# Patient Record
Sex: Female | Born: 1990 | Race: Black or African American | Hispanic: No | Marital: Single | Smoking: Former smoker
Health system: Southern US, Community
[De-identification: ages and names within clinical notes are randomized; demographics above are authoritative.]

## PROBLEM LIST (undated history)

## (undated) DIAGNOSIS — K219 Gastro-esophageal reflux disease without esophagitis: Secondary | ICD-10-CM

## (undated) HISTORY — PX: ADENOIDECTOMY: SUR15

## (undated) HISTORY — PX: TONSILLECTOMY: SUR1361

---
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
## 2012-04-01 ENCOUNTER — Encounter (HOSPITAL_COMMUNITY): Payer: Self-pay | Admitting: *Deleted

## 2012-04-01 ENCOUNTER — Emergency Department (HOSPITAL_COMMUNITY)
Admission: EM | Admit: 2012-04-01 | Discharge: 2012-04-01 | Disposition: A | Discharge status: Home / Self Care | Payer: Self-pay | Attending: Emergency Medicine | Admitting: Emergency Medicine

## 2012-04-01 DIAGNOSIS — R110 Nausea: Secondary | ICD-10-CM | POA: Insufficient documentation

## 2012-04-01 DIAGNOSIS — F17200 Nicotine dependence, unspecified, uncomplicated: Secondary | ICD-10-CM | POA: Insufficient documentation

## 2012-04-01 DIAGNOSIS — Z711 Person with feared health complaint in whom no diagnosis is made: Secondary | ICD-10-CM

## 2012-04-01 DIAGNOSIS — R197 Diarrhea, unspecified: Secondary | ICD-10-CM | POA: Insufficient documentation

## 2012-04-01 DIAGNOSIS — Z3202 Encounter for pregnancy test, result negative: Secondary | ICD-10-CM | POA: Insufficient documentation

## 2012-04-01 LAB — URINALYSIS, MICROSCOPIC ONLY
Bilirubin Urine: NEGATIVE
Glucose, UA: NEGATIVE mg/dL
Ketones, ur: 15 mg/dL — AB
Nitrite: NEGATIVE
Protein, ur: 30 mg/dL — AB
Specific Gravity, Urine: 1.026 (ref 1.005–1.030)
Urobilinogen, UA: 0.2 mg/dL (ref 0.0–1.0)
pH: 8.5 — ABNORMAL HIGH (ref 5.0–8.0)

## 2012-04-01 LAB — CBC WITH DIFFERENTIAL/PLATELET
Basophils Absolute: 0 K/uL (ref 0.0–0.1)
Basophils Relative: 0 % (ref 0–1)
Eosinophils Absolute: 0 10*3/uL (ref 0.0–0.7)
Eosinophils Relative: 1 % (ref 0–5)
HCT: 39.1 % (ref 36.0–46.0)
Hemoglobin: 13.1 g/dL (ref 12.0–15.0)
Lymphocytes Relative: 24 % (ref 12–46)
Lymphs Abs: 1.4 10*3/uL (ref 0.7–4.0)
MCH: 29 pg (ref 26.0–34.0)
MCHC: 33.5 g/dL (ref 30.0–36.0)
MCV: 86.5 fL (ref 78.0–100.0)
Monocytes Absolute: 0.3 10*3/uL (ref 0.1–1.0)
Monocytes Relative: 5 % (ref 3–12)
Neutro Abs: 4.3 K/uL (ref 1.7–7.7)
Neutrophils Relative %: 70 % (ref 43–77)
Platelets: 205 K/uL (ref 150–400)
RBC: 4.52 MIL/uL (ref 3.87–5.11)
RDW: 13.8 % (ref 11.5–15.5)
WBC: 6.1 K/uL (ref 4.0–10.5)

## 2012-04-01 LAB — COMPREHENSIVE METABOLIC PANEL
Albumin: 4.1 g/dL (ref 3.5–5.2)
Alkaline Phosphatase: 49 U/L (ref 39–117)
BUN: 9 mg/dL (ref 6–23)
Creatinine, Ser: 0.59 mg/dL (ref 0.50–1.10)
GFR calc Af Amer: >90 mL/min (ref >90)
Glucose, Bld: 115 mg/dL — ABNORMAL HIGH (ref 70–99)
Potassium: 3.5 mEq/L (ref 3.5–5.1)
Total Protein: 7.3 g/dL (ref 6.0–8.3)

## 2012-04-01 LAB — COMPREHENSIVE METABOLIC PANEL WITH GFR
ALT: 21 U/L (ref 0–35)
AST: 24 U/L (ref 0–37)
CO2: 23 meq/L (ref 19–32)
Calcium: 9.7 mg/dL (ref 8.4–10.5)
Chloride: 102 meq/L (ref 96–112)
GFR calc non Af Amer: >90 mL/min (ref >90)
Sodium: 137 meq/L (ref 135–145)
Total Bilirubin: 0.3 mg/dL (ref 0.3–1.2)

## 2012-04-01 LAB — WET PREP, GENITAL
Trich, Wet Prep: NONE SEEN
Yeast Wet Prep HPF POC: NONE SEEN

## 2012-04-01 LAB — LIPASE, BLOOD: Lipase: 31 U/L (ref 11–59)

## 2012-04-01 LAB — POCT PREGNANCY, URINE: Preg Test, Ur: NEGATIVE

## 2012-04-01 MED ORDER — TRAMADOL HCL 50 MG PO TABS: 50.0000 mg | ORAL_TABLET | Freq: Four times a day (QID) | ORAL | Status: DC | PRN

## 2012-04-01 MED ORDER — SODIUM CHLORIDE 0.9 % IV BOLUS (SEPSIS)
1000.0000 mL | Freq: Once | INTRAVENOUS | Status: AC
  Administered 2012-04-01: 1000 mL via INTRAVENOUS

## 2012-04-01 MED ORDER — PROMETHAZINE HCL 25 MG/ML IJ SOLN
25.0000 mg | INTRAMUSCULAR | Status: AC
  Administered 2012-04-01: 25 mg via INTRAMUSCULAR
  Filled 2012-04-01: qty 1

## 2012-04-01 MED ORDER — AZITHROMYCIN 250 MG PO TABS: 250.0000 mg | ORAL_TABLET | Freq: Every day | ORAL | Status: DC

## 2012-04-01 MED ORDER — HYDROCODONE-ACETAMINOPHEN 5-325 MG PO TABS
1.0000 | ORAL_TABLET | Freq: Once | ORAL | Status: AC
  Administered 2012-04-01: 1 via ORAL
  Filled 2012-04-01: qty 1

## 2012-04-01 MED ORDER — AZITHROMYCIN 250 MG PO TABS
1000.0000 mg | ORAL_TABLET | Freq: Once | ORAL | Status: AC
  Administered 2012-04-01: 1000 mg via ORAL
  Filled 2012-04-01: qty 4

## 2012-04-01 MED ORDER — METRONIDAZOLE 500 MG PO TABS
2000.0000 mg | ORAL_TABLET | Freq: Once | ORAL | Status: AC
  Administered 2012-04-01: 2000 mg via ORAL
  Filled 2012-04-01: qty 4

## 2012-04-01 MED ORDER — ONDANSETRON 4 MG PO TBDP
4.0000 mg | ORAL_TABLET | Freq: Once | ORAL | Status: DC
  Filled 2012-04-01: qty 1

## 2012-04-01 MED ORDER — DIPHENHYDRAMINE HCL 50 MG/ML IJ SOLN
25.0000 mg | Freq: Once | INTRAMUSCULAR | Status: AC
  Administered 2012-04-01: 25 mg via INTRAVENOUS
  Filled 2012-04-01: qty 1

## 2012-04-01 MED ORDER — CEFTRIAXONE SODIUM 250 MG IJ SOLR
250.0000 mg | Freq: Once | INTRAMUSCULAR | Status: AC
  Administered 2012-04-01: 250 mg via INTRAMUSCULAR
  Filled 2012-04-01: qty 250

## 2012-04-01 MED ORDER — PROMETHAZINE HCL 25 MG PO TABS: 25.0000 mg | ORAL_TABLET | Freq: Four times a day (QID) | ORAL | Status: DC | PRN

## 2012-04-01 MED ORDER — ONDANSETRON 4 MG PO TBDP
8.0000 mg | ORAL_TABLET | Freq: Once | ORAL | Status: AC
  Administered 2012-04-01: 8 mg via ORAL
  Filled 2012-04-01: qty 2

## 2012-04-01 NOTE — ED Notes (Signed)
Reports waking up this am with severe abd pain, nausea and diarrhea. Denies vomiting.

## 2012-04-01 NOTE — ED Notes (Signed)
Pt began vomiting. Protocol Zofran ordered.

## 2012-04-01 NOTE — ED Provider Notes (Signed)
Medical screening examination/treatment/procedure(s) were performed by non-physician practitioner and as supervising physician I was immediately available for consultation/collaboration.   Michele Yoder. Oletta Lamas, MD 04/01/12 1610

## 2012-04-01 NOTE — ED Notes (Signed)
Patient is alert and orientedx4. Patient was explained discharge instructions and she understood them with no questions.  Patient is being transported home by Jeralyn Bennett, a friend of her mother's.

## 2012-04-01 NOTE — ED Provider Notes (Signed)
History     CSN: [id number]  Arrival date & time 04/01/12  1317   First MD Initiated Contact with Patient 04/01/12 1641      Chief Complaint  Patient presents with  . Abdominal Pain  . Nausea    (Consider location/radiation/quality/duration/timing/severity/associated sxs/prior treatment) HPI  Pt comes to the emergency department with complaints of suprapubic pain. She says she woke up at  9 AM this morning with the pain accompanied by nausea and diarrhea. She said she also has had a  little bit of vomiting that since having the Zofran in triage it has resolved. She said that she's been  having yellowish vaginal discharge previously to starting her menstrual cycle she is currently on. She  did not see a doctor for this previously because she informs me "she does not have Medicaid". She  denies having any urinary symptoms of dysuria or urinary frequency. She denies having any back  pain, fevers, weakness or confusion. nad vss.  History reviewed. No pertinent past medical history.  Past Surgical History  Procedure Date  . Tonsillectomy   . Adenoidectomy     History reviewed. No pertinent family history.  History  Substance Use Topics  . Smoking status: Current Every Day Smoker  . Smokeless tobacco: Not on file  . Alcohol Use: No    OB History    Grav Para Term Preterm Abortions TAB SAB Ect Mult Living                  Review of Systems  Review of Systems  Gen: no weight loss, fevers, chills, night sweats  Eyes: no discharge or drainage, no occular pain or visual changes  Nose: no epistaxis or rhinorrhea  Mouth: no dental pain, no sore throat  Neck: no neck pain  Lungs:No wheezing, coughing or hemoptysis CV: no chest pain, palpitations, dependent edema or orthopnea  Abd: + suprapubic abdominal pain, nausea, vomiting  GU: no dysuria or gross hematuria, + vaginal discharge MSK:  No abnormalities  Neuro: no headache, no focal neurologic deficits  Skin: no  abnormalities Psyche: negative.   Allergies  Review of patient's allergies indicates no known allergies.  Home Medications   Current Outpatient Rx  Name  Route  Sig  Dispense  Refill  . IBUPROFEN 200 MG PO TABS   Oral   Take 400 mg by mouth every 6 (six) hours as needed. For fever/pain           BP 143/92  Pulse 105  Temp 97.8 F (36.6 C) (Oral)  Resp 30  SpO2 98%  LMP 04/01/2012  Physical Exam  Nursing note and vitals reviewed. Constitutional: She appears well-developed and well-nourished. No distress.  HENT:  Head: Normocephalic and atraumatic.  Eyes: Pupils are equal, round, and reactive to light.  Neck: Normal range of motion. Neck supple.  Cardiovascular: Normal rate and regular rhythm.   Pulmonary/Chest: Effort normal.  Abdominal: Soft.  Genitourinary: Uterus normal. Cervix exhibits no motion tenderness, no discharge and no friability. There is bleeding around the vagina. No erythema or tenderness around the vagina. No foreign body around the vagina. No signs of injury around the vagina. Vaginal discharge found.  Neurological: She is alert.  Skin: Skin is warm and dry.    ED Course  Procedures (including critical care time)  Labs Reviewed  COMPREHENSIVE METABOLIC PANEL - Abnormal; Notable for the following:    Glucose, Bld 115 (*)     All other components within normal limits  URINALYSIS, MICROSCOPIC ONLY - Abnormal; Notable for the following:    APPearance TURBID (*)     pH 8.5 (*)     Hgb urine dipstick LARGE (*)     Ketones, ur 15 (*)     Protein, ur 30 (*)     Leukocytes, UA TRACE (*)     Bacteria, UA FEW (*)     Squamous Epithelial / LPF FEW (*)     All other components within normal limits  WET PREP, GENITAL - Abnormal; Notable for the following:    WBC, Wet Prep HPF POC MODERATE (*)     All other components within normal limits  CBC WITH DIFFERENTIAL  LIPASE, BLOOD  POCT PREGNANCY, URINE  GC/CHLAMYDIA PROBE AMP  URINE CULTURE   No  results found.   1. Concern about STD in female without diagnosis       MDM  pts wet prep show moderate white blood cells. pts symptoms and wet prep findings are suggestive or gc/chlamydia. Culture has been sent out. Pt treated with Metronidazole, rocephin and Azithro in ED. Has been made aware of findings.  Low suspician for PID because pt is afebrile, not tachycardic and pain easily controlled with 1 Vicodin.  Pt has been advised of the symptoms that warrant their return to the ED. Patient has voiced understanding and has agreed to follow-up with the PCP or specialist.         Dorthula Matas, PA 04/01/12 1825

## 2012-04-01 NOTE — ED Notes (Signed)
Family at bedside. Patient is resting.

## 2012-04-02 LAB — GC/CHLAMYDIA PROBE AMP
CT Probe RNA: NEGATIVE
GC Probe RNA: NEGATIVE

## 2012-04-03 LAB — URINE CULTURE: Colony Count: 80000

## 2012-08-01 ENCOUNTER — Emergency Department (INDEPENDENT_AMBULATORY_CARE_PROVIDER_SITE_OTHER)
Admission: EM | Admit: 2012-08-01 | Discharge: 2012-08-01 | Disposition: A | Discharge status: Home Health | Payer: Medicaid Other | Source: Home / Self Care

## 2012-08-01 ENCOUNTER — Encounter (HOSPITAL_COMMUNITY): Payer: Self-pay

## 2012-08-01 DIAGNOSIS — R1110 Vomiting, unspecified: Secondary | ICD-10-CM

## 2012-08-01 LAB — POCT PREGNANCY, URINE: Preg Test, Ur: NEGATIVE

## 2012-08-01 MED ORDER — ONDANSETRON HCL 4 MG PO TABS: 4.0000 mg | ORAL_TABLET | Freq: Three times a day (TID) | ORAL | Status: DC | PRN

## 2012-08-01 MED ORDER — OMEPRAZOLE 40 MG PO CPDR: 40.0000 mg | DELAYED_RELEASE_CAPSULE | Freq: Every day | ORAL | Status: DC

## 2012-08-01 NOTE — ED Provider Notes (Signed)
Patient Demographics  Michele Yoder, is a 22 y.o. female  WUJ:[id number]  NFA:[id number]  DOB - 08-24-1990  Chief Complaint  Patient presents with  . Emesis        Subjective:   Michele Yoder today is here to establish primary care. Patient's main complaint is nausea and vomiting, she claims that she has had this for the past with today's-none today. She claims that this has been a intermittent/recurrent problem since 2010 when she was told she had peptic ulcer disease. She claims that she checked a urine pregnancy test yesterday and was negative.  Patient has No headache, No chest pain, No abdominal pain - No Nausea, No new weakness tingling or numbness, No Cough - SOB.   Objective:    Filed Vitals:   08/01/12 1028  BP: 101/58  Pulse: 83  Temp: 97.7 F (36.5 C)  TempSrc: Oral  Resp: 16  SpO2: 100%     ALLERGIES:  No Known Allergies  PAST MEDICAL HISTORY: Gastroesophageal reflux disease  PAST SURGICAL HISTORY: Past Surgical History  Procedure Laterality Date  . Tonsillectomy    . Adenoidectomy      FAMILY HISTORY: No family history of CAD  MEDICATIONS AT HOME: Prior to Admission medications   Medication Sig Start Date End Date Taking? Authorizing Provider  azithromycin (ZITHROMAX) 250 MG tablet Take 1 tablet (250 mg total) by mouth daily. Take first 2 tablets together, then 1 every day until finished. 04/01/12   Tiffany Irine Seal, PA-C  ibuprofen (ADVIL,MOTRIN) 200 MG tablet Take 400 mg by mouth every 6 (six) hours as needed. For fever/pain    Historical Provider, MD  promethazine (PHENERGAN) 25 MG tablet Take 1 tablet (25 mg total) by mouth every 6 (six) hours as needed for nausea. 04/01/12   Tiffany Irine Seal, PA-C  traMADol (ULTRAM) 50 MG tablet Take 1 tablet (50 mg total) by mouth every 6 (six) hours as needed for pain. 04/01/12   Dorthula Matas, PA-C    REVIEW OF SYSTEMS:  Constitutional:   No   Fevers, chills, fatigue.  HEENT:    No headaches,  Sore throat,   Cardio-vascular: No chest pain,  Orthopnea, swelling in lower extremities, anasarca, palpitations  GI:  No abdominal pain,  diarrhea  Resp: No shortness of breath,  No coughing up of blood.No cough.No wheezing.  Skin:  no rash or lesions.  GU:  no dysuria, change in color of urine, no urgency or frequency.  No flank pain.  Musculoskeletal: No joint pain or swelling.  No decreased range of motion.  No back pain.  Psych: No change in mood or affect. No depression or anxiety.  No memory loss.   Exam  General appearance :Awake, alert, not in any distress. Speech Clear. Not toxic Looking HEENT: Atraumatic and Normocephalic, pupils equally reactive to light and accomodation Neck: supple, no JVD. No cervical lymphadenopathy.  Chest:Good air entry bilaterally, no added sounds  CVS: S1 S2 regular, no murmurs.  Abdomen: Bowel sounds present, Non tender and not distended with no gaurding, rigidity or rebound. Extremities: B/L Lower Ext shows no edema, both legs are warm to touch Neurology: Awake alert, and oriented X 3, CN II-XII intact, Non focal Skin:No Rash Wounds:N/A    Data Review   CBC No results found for this basename: WBC, HGB, HCT, PLT, MCV, MCH, MCHC, RDW, NEUTRABS, LYMPHSABS, MONOABS, EOSABS, BASOSABS, BANDABS, BANDSABD,  in the last 168 hours  Chemistries   No results found for this basename: NA, K,  CL, CO2, GLUCOSE, BUN, CREATININE, GFRCGP, CALCIUM, MG, AST, ALT, ALKPHOS, BILITOT,  in the last 168 hours ------------------------------------------------------------------------------------------------------------------ No results found for this basename: HGBA1C,  in the last 72 hours ------------------------------------------------------------------------------------------------------------------ No results found for this basename: CHOL, HDL, LDLCALC, TRIG, CHOLHDL, LDLDIRECT,  in the last 72  hours ------------------------------------------------------------------------------------------------------------------ No results found for this basename: TSH, T4TOTAL, FREET3, T3FREE, THYROIDAB,  in the last 72 hours ------------------------------------------------------------------------------------------------------------------ No results found for this basename: VITAMINB12, FOLATE, FERRITIN, TIBC, IRON, RETICCTPCT,  in the last 72 hours  Coagulation profile  No results found for this basename: INR, PROTIME,  in the last 168 hours    Assessment & Plan   Intermittent nausea and vomiting - pregnancy test-negative - Given history of PUD/GERD-Will start PPI and as needed Zofran - Followup in 2 weeks-for a quick reassessment    Follow-up Information   Follow up with [facility]. Schedule an appointment as soon as possible for a visit in 2 weeks.      Maretta Bees, MD 08/01/12 [phone]

## 2012-08-01 NOTE — ED Notes (Signed)
Patient has vomiting on and off  Past couple of days Mostly in the morning but periodically during the day as well

## 2012-08-07 ENCOUNTER — Emergency Department (INDEPENDENT_AMBULATORY_CARE_PROVIDER_SITE_OTHER)
Admission: EM | Admit: 2012-08-07 | Discharge: 2012-08-07 | Disposition: A | Discharge status: Home / Self Care | Payer: Medicaid Other | Source: Home / Self Care

## 2012-08-07 ENCOUNTER — Encounter (HOSPITAL_COMMUNITY): Payer: Self-pay

## 2012-08-07 DIAGNOSIS — R1110 Vomiting, unspecified: Secondary | ICD-10-CM

## 2012-08-07 LAB — COMPREHENSIVE METABOLIC PANEL
ALT: 25 U/L (ref 0–35)
AST: 29 U/L (ref 0–37)
Alkaline Phosphatase: 44 U/L (ref 39–117)
CO2: 25 mEq/L (ref 19–32)
Calcium: 9.2 mg/dL (ref 8.4–10.5)
Chloride: 103 mEq/L (ref 96–112)
GFR calc non Af Amer: >90 mL/min (ref >90)
Glucose, Bld: 85 mg/dL (ref 70–99)
Potassium: 4 mEq/L (ref 3.5–5.1)
Sodium: 137 mEq/L (ref 135–145)
Total Bilirubin: 0.2 mg/dL — ABNORMAL LOW (ref 0.3–1.2)

## 2012-08-07 LAB — CBC
Hemoglobin: 12.5 g/dL (ref 12.0–15.0)
Platelets: 147 10*3/uL — ABNORMAL LOW (ref 150–400)
RBC: 4.33 MIL/uL (ref 3.87–5.11)
WBC: 4.9 10*3/uL (ref 4.0–10.5)

## 2012-08-07 LAB — TSH: TSH: 0.777 u[IU]/mL (ref 0.350–4.500)

## 2012-08-07 NOTE — ED Notes (Signed)
Patient here for blood work Cbc cmp tsh

## 2012-09-06 ENCOUNTER — Emergency Department (HOSPITAL_COMMUNITY)
Admission: EM | Admit: 2012-09-06 | Discharge: 2012-09-06 | Disposition: A | Discharge status: Home / Self Care | Payer: Medicaid Other | Attending: Emergency Medicine | Admitting: Emergency Medicine

## 2012-09-06 DIAGNOSIS — R51 Headache: Secondary | ICD-10-CM | POA: Insufficient documentation

## 2012-09-06 DIAGNOSIS — R109 Unspecified abdominal pain: Secondary | ICD-10-CM

## 2012-09-06 DIAGNOSIS — Z3202 Encounter for pregnancy test, result negative: Secondary | ICD-10-CM | POA: Insufficient documentation

## 2012-09-06 DIAGNOSIS — F17200 Nicotine dependence, unspecified, uncomplicated: Secondary | ICD-10-CM | POA: Insufficient documentation

## 2012-09-06 DIAGNOSIS — R110 Nausea: Secondary | ICD-10-CM | POA: Insufficient documentation

## 2012-09-06 DIAGNOSIS — K219 Gastro-esophageal reflux disease without esophagitis: Secondary | ICD-10-CM | POA: Insufficient documentation

## 2012-09-06 LAB — URINALYSIS, ROUTINE W REFLEX MICROSCOPIC
Bilirubin Urine: NEGATIVE
Glucose, UA: NEGATIVE mg/dL
Hgb urine dipstick: NEGATIVE
Ketones, ur: NEGATIVE mg/dL
Leukocytes, UA: NEGATIVE
Nitrite: NEGATIVE
Protein, ur: NEGATIVE mg/dL
Specific Gravity, Urine: 1.021 (ref 1.005–1.030)
Urobilinogen, UA: 0.2 mg/dL (ref 0.0–1.0)
pH: 6.5 (ref 5.0–8.0)

## 2012-09-06 LAB — LIPASE, BLOOD: Lipase: 33 U/L (ref 11–59)

## 2012-09-06 LAB — CBC WITH DIFFERENTIAL/PLATELET
Basophils Absolute: 0 10*3/uL (ref 0.0–0.1)
Basophils Relative: 0 % (ref 0–1)
Eosinophils Absolute: 0.2 10*3/uL (ref 0.0–0.7)
Eosinophils Relative: 2 % (ref 0–5)
HCT: 33.6 % — ABNORMAL LOW (ref 36.0–46.0)
Hemoglobin: 11.3 g/dL — ABNORMAL LOW (ref 12.0–15.0)
Lymphocytes Relative: 34 % (ref 12–46)
Lymphs Abs: 2.6 10*3/uL (ref 0.7–4.0)
MCH: 29 pg (ref 26.0–34.0)
MCHC: 33.6 g/dL (ref 30.0–36.0)
MCV: 86.4 fL (ref 78.0–100.0)
Monocytes Absolute: 0.5 10*3/uL (ref 0.1–1.0)
Monocytes Relative: 7 % (ref 3–12)
Neutro Abs: 4.3 10*3/uL (ref 1.7–7.7)
Neutrophils Relative %: 57 % (ref 43–77)
Platelets: 151 10*3/uL (ref 150–400)
RBC: 3.89 MIL/uL (ref 3.87–5.11)
RDW: 13.5 % (ref 11.5–15.5)
WBC: 7.5 10*3/uL (ref 4.0–10.5)

## 2012-09-06 LAB — COMPREHENSIVE METABOLIC PANEL
ALT: 14 U/L (ref 0–35)
AST: 16 U/L (ref 0–37)
Albumin: 3.5 g/dL (ref 3.5–5.2)
Alkaline Phosphatase: 45 U/L (ref 39–117)
BUN: 8 mg/dL (ref 6–23)
CO2: 25 mEq/L (ref 19–32)
Calcium: 8.9 mg/dL (ref 8.4–10.5)
Chloride: 105 mEq/L (ref 96–112)
Creatinine, Ser: 0.69 mg/dL (ref 0.50–1.10)
GFR calc Af Amer: >90 mL/min (ref >90)
GFR calc non Af Amer: >90 mL/min (ref >90)
Glucose, Bld: 88 mg/dL (ref 70–99)
Potassium: 3.5 mEq/L (ref 3.5–5.1)
Sodium: 139 mEq/L (ref 135–145)
Total Bilirubin: 0.2 mg/dL — ABNORMAL LOW (ref 0.3–1.2)
Total Protein: 6.4 g/dL (ref 6.0–8.3)

## 2012-09-06 LAB — PREGNANCY, URINE: Preg Test, Ur: NEGATIVE

## 2012-09-06 MED ORDER — PANTOPRAZOLE SODIUM 40 MG PO TBEC
40.0000 mg | DELAYED_RELEASE_TABLET | Freq: Once | ORAL | Status: AC
  Administered 2012-09-06: 40 mg via ORAL
  Filled 2012-09-06: qty 1

## 2012-09-06 MED ORDER — SODIUM CHLORIDE 0.9 % IV BOLUS (SEPSIS)
1000.0000 mL | Freq: Once | INTRAVENOUS | Status: AC
  Administered 2012-09-06: 1000 mL via INTRAVENOUS

## 2012-09-06 MED ORDER — GI COCKTAIL ~~LOC~~
30.0000 mL | Freq: Once | ORAL | Status: AC
  Administered 2012-09-06: 30 mL via ORAL
  Filled 2012-09-06: qty 30

## 2012-09-06 MED ORDER — SUCRALFATE 1 G PO TABS: 1.0000 g | ORAL_TABLET | Freq: Four times a day (QID) | ORAL | Status: DC

## 2012-09-06 MED ORDER — FAMOTIDINE 20 MG PO TABS: 20.0000 mg | ORAL_TABLET | Freq: Two times a day (BID) | ORAL | Status: DC

## 2012-09-06 NOTE — ED Notes (Signed)
Per pt, she has been having abdominal  Burning for months. She stated that it has been becoming worse for the last 3 days. He also stated that he has been having nausea. No vomiting. Pt stated that she sometimes have a difficult time finding food due to financial and personal issues. Pt stated that she also has been having headaches for several months. She stated that she has been having increased fatigue.

## 2012-09-06 NOTE — ED Notes (Signed)
Discharge instructions reviewed with pt. Pt verbalized understanding.   

## 2012-09-06 NOTE — ED Notes (Signed)
Report given to [facility]

## 2012-09-06 NOTE — ED Provider Notes (Signed)
History     CSN: [id number]  Arrival date & time 09/06/12  0612   First MD Initiated Contact with Patient 09/06/12 [phone]      Chief Complaint  Patient presents with  . Abdominal Pain  . Headache    (Consider location/radiation/quality/duration/timing/severity/associated sxs/prior treatment) HPI Patient presents to the emergency department with abdominal burning and nausea.  Patient, states, has been an ongoing problem for her.  The last several months.  Patient, states she's been seen several times for this.  The patient, states, that she has not been taking her medicines properly.  She states that she does not have any chest pain, shortness of breath, back pain, vomiting, diarrhea, dysuria, fever, cough, sore throat, rash, dizziness, weakness, numbness, or syncope.  Patient, states, that she's not had any vaginal bleeding, or discharge other than her normal menstrual cycle No past medical history on file.  Past Surgical History  Procedure Laterality Date  . Tonsillectomy    . Adenoidectomy      No family history on file.  History  Substance Use Topics  . Smoking status: Current Every Day Smoker  . Smokeless tobacco: Not on file  . Alcohol Use: No    OB History   Grav Para Term Preterm Abortions TAB SAB Ect Mult Living                  Review of Systems All other systems negative except as documented in the HPI. All pertinent positives and negatives as reviewed in the HPI. Allergies  Review of patient's allergies indicates no known allergies.  Home Medications   Current Outpatient Rx  Name  Route  Sig  Dispense  Refill  . Aspirin-Acetaminophen-Caffeine (EXCEDRIN PO)   Oral   Take 1 tablet by mouth once.         Marland Kitchen ibuprofen (ADVIL,MOTRIN) 200 MG tablet   Oral   Take 400 mg by mouth every 6 (six) hours as needed for pain.           BP 107/56  Pulse 80  Temp(Src) 97.8 F (36.6 C) (Oral)  Resp 18  SpO2 100%  Physical Exam  Nursing note and vitals  reviewed. Constitutional: She is oriented to person, place, and time. She appears well-developed and well-nourished. No distress.  HENT:  Head: Normocephalic and atraumatic.  Mouth/Throat: Oropharynx is clear and moist.  Eyes: Pupils are equal, round, and reactive to light.  Neck: Normal range of motion. Neck supple.  Cardiovascular: Normal rate, regular rhythm and normal heart sounds.  Exam reveals no gallop and no friction rub.   No murmur heard. Pulmonary/Chest: Effort normal and breath sounds normal. No respiratory distress. She has no wheezes.  Abdominal: Soft. Bowel sounds are normal. She exhibits no distension. There is no tenderness. There is no guarding.  Neurological: She is alert and oriented to person, place, and time. She exhibits normal muscle tone. Coordination normal.  Skin: Skin is warm and dry. No rash noted.    ED Course  Procedures (including critical care time)  Labs Reviewed  CBC WITH DIFFERENTIAL - Abnormal; Notable for the following:    Hemoglobin 11.3 (*)    HCT 33.6 (*)    All other components within normal limits  COMPREHENSIVE METABOLIC PANEL - Abnormal; Notable for the following:    Total Bilirubin 0.2 (*)    All other components within normal limits  URINALYSIS, ROUTINE W REFLEX MICROSCOPIC - Abnormal; Notable for the following:    APPearance CLOUDY (*)  All other components within normal limits  LIPASE, BLOOD  PREGNANCY, URINE   Patient has been evaluated for this complaint in the past.  Here in the emergency department.  Patient is asked to take her medicines as prescribed.  Patient will be referred to GI as needed.  He is told to return here for any worsening in her condition.  The patient, states, that she has not followed up after her ER visits.  Patient's vitals remained stable here in the emergency department.  Patient's been rechecked x2.  All questions were answered  Filed Vitals:   09/06/12 0700 09/06/12 0830 09/06/12 0930 09/06/12 1000   BP: 110/70 103/47 114/38 107/56  Pulse: 68 60 70 80  Temp:      TempSrc:      Resp:      SpO2: 100% 99% 97% 100%   MDM  MDM Reviewed: vitals, nursing note and previous chart Reviewed previous: labs Interpretation: labs   '         Carlyle Dolly, PA-C 09/06/12 1057

## 2012-09-06 NOTE — ED Provider Notes (Signed)
Medical screening examination/treatment/procedure(s) were performed by non-physician practitioner and as supervising physician I was immediately available for consultation/collaboration.   Loren Racer, MD 09/06/12 1323

## 2012-09-10 ENCOUNTER — Emergency Department (HOSPITAL_COMMUNITY)
Admission: EM | Admit: 2012-09-10 | Discharge: 2012-09-10 | Disposition: A | Discharge status: Home / Self Care | Payer: Medicaid Other | Attending: Emergency Medicine | Admitting: Emergency Medicine

## 2012-09-10 ENCOUNTER — Emergency Department (HOSPITAL_COMMUNITY): Payer: Medicaid Other

## 2012-09-10 ENCOUNTER — Encounter (HOSPITAL_COMMUNITY): Payer: Self-pay | Admitting: *Deleted

## 2012-09-10 DIAGNOSIS — F17200 Nicotine dependence, unspecified, uncomplicated: Secondary | ICD-10-CM | POA: Insufficient documentation

## 2012-09-10 DIAGNOSIS — N946 Dysmenorrhea, unspecified: Secondary | ICD-10-CM | POA: Insufficient documentation

## 2012-09-10 DIAGNOSIS — Z3202 Encounter for pregnancy test, result negative: Secondary | ICD-10-CM | POA: Insufficient documentation

## 2012-09-10 DIAGNOSIS — N949 Unspecified condition associated with female genital organs and menstrual cycle: Secondary | ICD-10-CM | POA: Insufficient documentation

## 2012-09-10 LAB — CBC WITH DIFFERENTIAL/PLATELET
Basophils Absolute: 0.2 10*3/uL — ABNORMAL HIGH (ref 0.0–0.1)
Eosinophils Relative: 2 % (ref 0–5)
Lymphs Abs: 2.8 10*3/uL (ref 0.7–4.0)
Monocytes Relative: 6 % (ref 3–12)
Neutrophils Relative %: 59 % (ref 43–77)
Platelets: 205 10*3/uL (ref 150–400)
RBC: 4.42 MIL/uL (ref 3.87–5.11)
RDW: 13.3 % (ref 11.5–15.5)
WBC: 8.9 10*3/uL (ref 4.0–10.5)

## 2012-09-10 LAB — URINE MICROSCOPIC-ADD ON

## 2012-09-10 LAB — URINALYSIS, ROUTINE W REFLEX MICROSCOPIC
Leukocytes, UA: NEGATIVE
Nitrite: NEGATIVE
Specific Gravity, Urine: 1.04 — ABNORMAL HIGH (ref 1.005–1.030)
Urobilinogen, UA: 1 mg/dL (ref 0.0–1.0)
pH: 6 (ref 5.0–8.0)

## 2012-09-10 LAB — BASIC METABOLIC PANEL
CO2: 22 mEq/L (ref 19–32)
Calcium: 9.1 mg/dL (ref 8.4–10.5)
Chloride: 106 mEq/L (ref 96–112)
Creatinine, Ser: 0.64 mg/dL (ref 0.50–1.10)
GFR calc Af Amer: >90 mL/min (ref >90)
Sodium: 140 mEq/L (ref 135–145)

## 2012-09-10 LAB — PREGNANCY, URINE: Preg Test, Ur: NEGATIVE

## 2012-09-10 MED ORDER — ACETAMINOPHEN 325 MG PO TABS: 650.0000 mg | ORAL_TABLET | Freq: Once | ORAL | Status: DC

## 2012-09-10 MED ORDER — ONDANSETRON 4 MG PO TBDP
4.0000 mg | ORAL_TABLET | Freq: Once | ORAL | Status: AC
  Administered 2012-09-10: 4 mg via ORAL
  Filled 2012-09-10: qty 1

## 2012-09-10 MED ORDER — KETOROLAC TROMETHAMINE 30 MG/ML IJ SOLN: 30.0000 mg | Freq: Once | INTRAMUSCULAR | Status: DC

## 2012-09-10 MED ORDER — MORPHINE SULFATE 4 MG/ML IJ SOLN
4.0000 mg | Freq: Once | INTRAMUSCULAR | Status: AC
  Administered 2012-09-10: 4 mg via INTRAMUSCULAR
  Filled 2012-09-10: qty 1

## 2012-09-10 MED ORDER — IBUPROFEN 800 MG PO TABS: 800.0000 mg | ORAL_TABLET | Freq: Once | ORAL | Status: DC

## 2012-09-10 MED ORDER — ONDANSETRON HCL 4 MG/2ML IJ SOLN
INTRAMUSCULAR | Status: AC
  Administered 2012-09-10: 4 mg via INTRAVENOUS
  Filled 2012-09-10: qty 2

## 2012-09-10 MED ORDER — HYDROMORPHONE HCL PF 1 MG/ML IJ SOLN
1.0000 mg | Freq: Once | INTRAMUSCULAR | Status: AC
  Administered 2012-09-10: 1 mg via INTRAVENOUS
  Filled 2012-09-10: qty 1

## 2012-09-10 MED ORDER — ONDANSETRON HCL 4 MG/2ML IJ SOLN: 4.0000 mg | Freq: Once | INTRAMUSCULAR | Status: AC

## 2012-09-10 NOTE — ED Notes (Signed)
Pt made aware can not drive after receiving dilaudid. States will call her mom to pick her up.

## 2012-09-10 NOTE — ED Notes (Signed)
Pt discharged.Vital signs stable and GCS 15 

## 2012-09-10 NOTE — ED Provider Notes (Signed)
Medical screening examination/treatment/procedure(s) were performed by non-physician practitioner and as supervising physician I was immediately available for consultation/collaboration.    Gilda Crease, MD 09/10/12 1124

## 2012-09-10 NOTE — ED Provider Notes (Signed)
History     CSN: [id number]  Arrival date & time 09/10/12  0808   First MD Initiated Contact with Patient 09/10/12 [phone]      Chief Complaint  Patient presents with  . Abdominal Cramping    (Consider location/radiation/quality/duration/timing/severity/associated sxs/prior treatment) HPI  Michele Yoder is a 22 y.o.female presenting to the ER with complaints of abdominal cramping and menstrual bleeding that started acutely while at home this morning. She has a history of severe cramping but tells me it has never been this bad. She started bleeding today. Had a negative urine pregnancy test on 09/06/2012, she is sexually active and not on birth control nor using condoms. She denies any vaginal discharge, dysuria, dyspareunia, vomiting or diarrhea. No upper abdominal pain it is all localized to her pelvis. Pt is in fetal position on the stretcher, unable to sit still and crying.  History reviewed. No pertinent past medical history.  Past Surgical History  Procedure Laterality Date  . Tonsillectomy    . Adenoidectomy      No family history on file.  History  Substance Use Topics  . Smoking status: Current Every Day Smoker  . Smokeless tobacco: Not on file  . Alcohol Use: No    OB History   Grav Para Term Preterm Abortions TAB SAB Ect Mult Living                  Review of Systems  Review of Systems  Gen: no weight loss, fevers, chills, night sweats  Eyes: no discharge or drainage, no occular pain or visual changes  Nose: no epistaxis or rhinorrhea  Mouth: no dental pain, no sore throat  Neck: no neck pain  Lungs:No wheezing, coughing or hemoptysis CV: no chest pain, palpitations, dependent edema or orthopnea  Abd: no abdominal pain, nausea, vomiting  GU: no dysuria or gross hematuria  +pelvic pain MSK:  No abnormalities  Neuro: no headache, no focal neurologic deficits  Skin: no abnormalities Psyche: negative.    Allergies  Review of patient's allergies  indicates no known allergies.  Home Medications   Current Outpatient Rx  Name  Route  Sig  Dispense  Refill  . ibuprofen (ADVIL,MOTRIN) 200 MG tablet   Oral   Take 800 mg by mouth every 6 (six) hours as needed for pain.            BP 116/73  Pulse 62  Temp(Src) 97.1 F (36.2 C) (Oral)  Resp 20  SpO2 100%  LMP 09/10/2012  Physical Exam  Nursing note and vitals reviewed. Constitutional: She appears well-developed and well-nourished. No distress.  HENT:  Head: Normocephalic and atraumatic.  Eyes: Pupils are equal, round, and reactive to light.  Neck: Normal range of motion. Neck supple.  Cardiovascular: Normal rate and regular rhythm.   Pulmonary/Chest: Effort normal.  Abdominal: Soft.  Genitourinary: Vagina normal. Uterus is tender.  Pt declined pelvic, would not tolerate  Neurological: She is alert.  Skin: Skin is warm and dry.    ED Course  Procedures (including critical care time)  Labs Reviewed  URINALYSIS, ROUTINE W REFLEX MICROSCOPIC - Abnormal; Notable for the following:    Color, Urine AMBER (*)    APPearance TURBID (*)    Specific Gravity, Urine 1.040 (*)    Hgb urine dipstick LARGE (*)    Protein, ur 30 (*)    All other components within normal limits  BASIC METABOLIC PANEL - Abnormal; Notable for the following:    Glucose, Bld  100 (*)    All other components within normal limits  URINE MICROSCOPIC-ADD ON - Abnormal; Notable for the following:    Squamous Epithelial / LPF MANY (*)    Bacteria, UA FEW (*)    All other components within normal limits  URINE CULTURE  PREGNANCY, URINE  CBC WITH DIFFERENTIAL   US Transvaginal Non-ob  09/10/2012   *RADIOLOGY REPORT*  Clinical Data:  Abdominal cramping.  Question ovarian torsion.  TRANSABDOMINAL AND TRANSVAGINAL ULTRASOUND OF PELVIS DOPPLER ULTRASOUND OF OVARIES  Technique:  Both transabdominal and transvaginal ultrasound examinations of the pelvis were performed. Transabdominal technique was performed  for global imaging of the pelvis including uterus, ovaries, adnexal regions, and pelvic cul-de-sac.  It was necessary to proceed with endovaginal exam following the transabdominal exam to visualize the ovaries.  Color and duplex Doppler ultrasound was utilized to evaluate blood flow to the ovaries.  Comparison:  None.  FINDINGS  Uterus:  Measures 7.0 x 3.0 x 4.0 cm and is normal in parenchymal echogenicity.  Endometrium:  Measures 4 mm, within normal limits.  Right ovary: Measures 3.1 x 2.5 x 2.4 cm, negative.  Left ovary: Measures 2.8 x 1.9 x 2.6 cm, negative.  Pulsed Doppler evaluation demonstrates normal low-resistance arterial and venous waveforms in both ovaries.  Additional finding:  No free fluid.  IMPRESSION:  Normal exam.  No evidence of pelvic mass or other significant abnormality.  No sonographic evidence for ovarian torsion.   Original Report Authenticated By: Leanna Battles, M.D.   US Pelvis Complete  09/10/2012   *RADIOLOGY REPORT*  Clinical Data:  Abdominal cramping.  Question ovarian torsion.  TRANSABDOMINAL AND TRANSVAGINAL ULTRASOUND OF PELVIS DOPPLER ULTRASOUND OF OVARIES  Technique:  Both transabdominal and transvaginal ultrasound examinations of the pelvis were performed. Transabdominal technique was performed for global imaging of the pelvis including uterus, ovaries, adnexal regions, and pelvic cul-de-sac.  It was necessary to proceed with endovaginal exam following the transabdominal exam to visualize the ovaries.  Color and duplex Doppler ultrasound was utilized to evaluate blood flow to the ovaries.  Comparison:  None.  FINDINGS  Uterus:  Measures 7.0 x 3.0 x 4.0 cm and is normal in parenchymal echogenicity.  Endometrium:  Measures 4 mm, within normal limits.  Right ovary: Measures 3.1 x 2.5 x 2.4 cm, negative.  Left ovary: Measures 2.8 x 1.9 x 2.6 cm, negative.  Pulsed Doppler evaluation demonstrates normal low-resistance arterial and venous waveforms in both ovaries.  Additional  finding:  No free fluid.  IMPRESSION:  Normal exam.  No evidence of pelvic mass or other significant abnormality.  No sonographic evidence for ovarian torsion.   Original Report Authenticated By: Leanna Battles, M.D.   [place] Art/ven Flow Abd Pelv Doppler  09/10/2012   *RADIOLOGY REPORT*  Clinical Data:  Abdominal cramping.  Question ovarian torsion.  TRANSABDOMINAL AND TRANSVAGINAL ULTRASOUND OF PELVIS DOPPLER ULTRASOUND OF OVARIES  Technique:  Both transabdominal and transvaginal ultrasound examinations of the pelvis were performed. Transabdominal technique was performed for global imaging of the pelvis including uterus, ovaries, adnexal regions, and pelvic cul-de-sac.  It was necessary to proceed with endovaginal exam following the transabdominal exam to visualize the ovaries.  Color and duplex Doppler ultrasound was utilized to evaluate blood flow to the ovaries.  Comparison:  None.  FINDINGS  Uterus:  Measures 7.0 x 3.0 x 4.0 cm and is normal in parenchymal echogenicity.  Endometrium:  Measures 4 mm, within normal limits.  Right ovary: Measures 3.1 x 2.5 x 2.4 cm,  negative.  Left ovary: Measures 2.8 x 1.9 x 2.6 cm, negative.  Pulsed Doppler evaluation demonstrates normal low-resistance arterial and venous waveforms in both ovaries.  Additional finding:  No free fluid.  IMPRESSION:  Normal exam.  No evidence of pelvic mass or other significant abnormality.  No sonographic evidence for ovarian torsion.   Original Report Authenticated By: Leanna Battles, M.D.     1. Dysmenorrhea       MDM  pts pain managed with IV pain medication. US showed no abnormality to explain pain. Highly doubt appendicitis because no elevated white count, nausea or vomiting. No RLQ pain. Will refer to womens for her menstrual pains.  22 y.o.Cailie L Cahall's evaluation in the Emergency Department is complete. It has been determined that no acute conditions requiring further emergency intervention are present at this time. The  patient/guardian have been advised of the diagnosis and plan. We have discussed signs and symptoms that warrant return to the ED, such as changes or worsening in symptoms.  Vital signs are stable at discharge. Filed Vitals:   09/10/12 1058  BP: 116/73  Pulse: 62  Temp:   Resp: 20    Patient/guardian has voiced understanding and agreed to follow-up with the PCP or specialist.   Dorthula Matas, PA-C 09/10/12 1122

## 2012-09-10 NOTE — ED Notes (Signed)
Pt states that she is having lower abdominal cramps. Pt states that this started this morning. Pt reports that her period started this morning as well. Pt took advil at home with no relief. Pt states that pain is worse than normal with her period.

## 2012-09-11 LAB — URINE CULTURE: Colony Count: 25000

## 2012-10-16 ENCOUNTER — Encounter: Payer: Medicaid Other | Admitting: Obstetrics & Gynecology

## 2013-07-27 IMAGING — CT CT ABD-PELV W/ CM
2 of 4 series · 16 of 46 positions shown, 18 images · IV contrast (omnipaque)
Comparison: CT of the abdomen and pelvis, and renal ultrasound
performed 07/07/2008

CLINICAL DATA: Periumbilical abdominal pain.

CT ABDOMEN AND PELVIS WITH CONTRAST
TECHNIQUE: Multidetector CT imaging of the abdomen and pelvis was
performed following the standard protocol during bolus
administration of intravenous contrast.
Contrast: 70mL OMNIPAQUE IOHEXOL 300 MG/ML IV SOLN

[Series 2: abd/pelv with 5.0 b31f st · axial · 0.51mm/px · z∈[+749,+1144]mm · 13 of 87 slices shown, 15 images]
[im 4/87  soft-tissue]
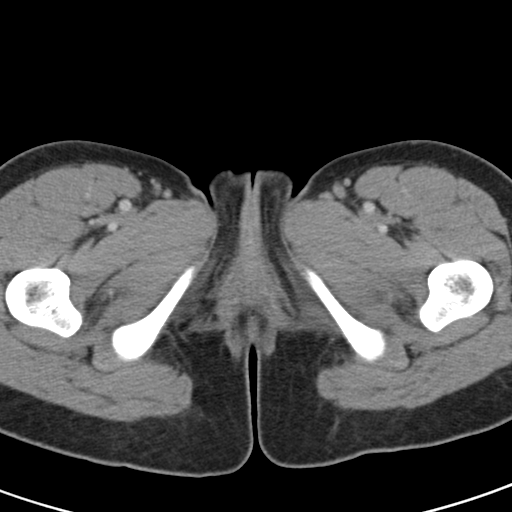
[im 4/87  bone]
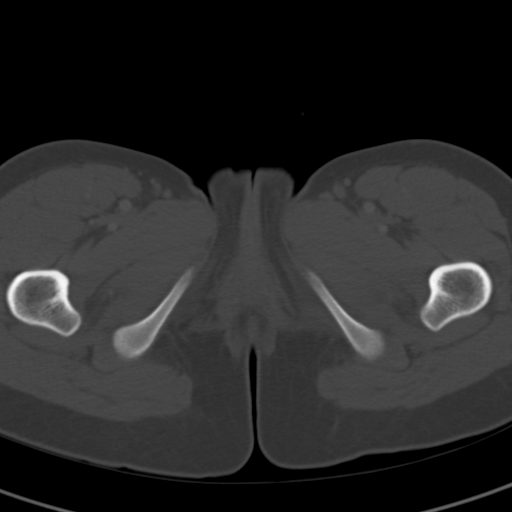
[im 11/87  soft-tissue]
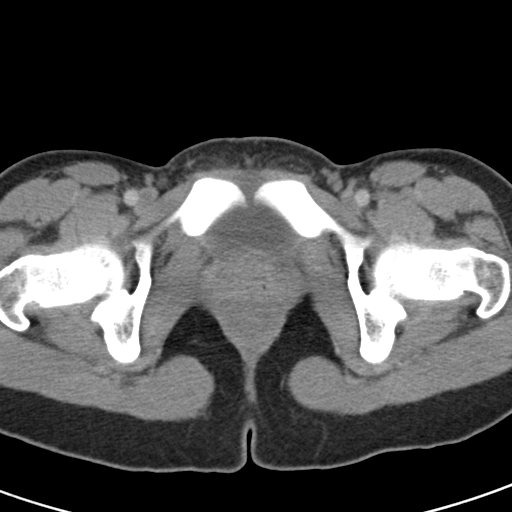
[im 18/87  soft-tissue]
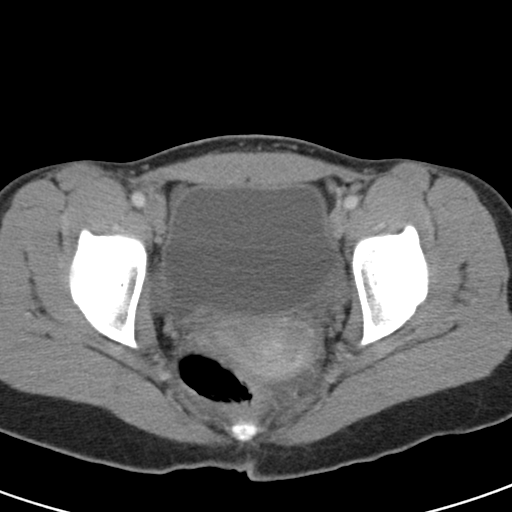
[im 25/87  soft-tissue]
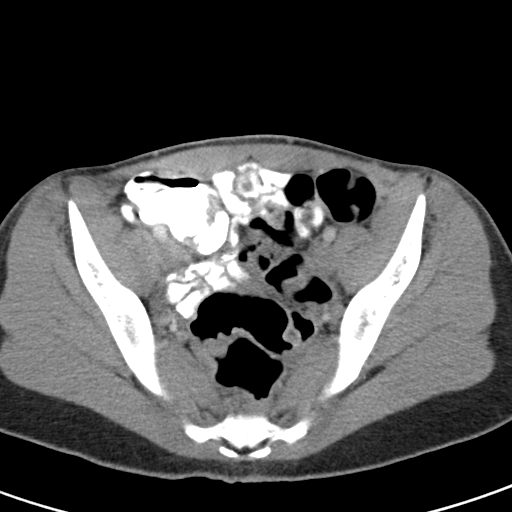
[im 31/87  soft-tissue]
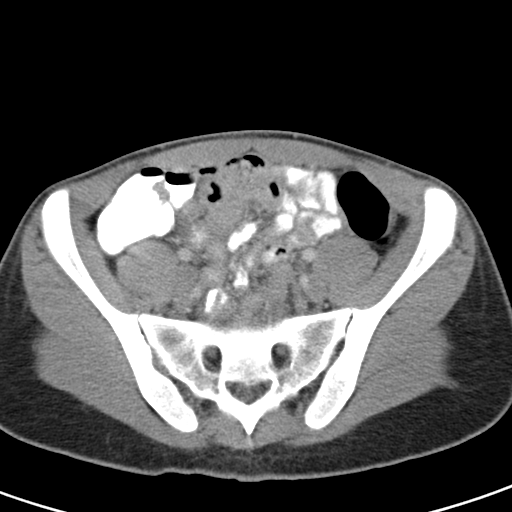
[im 38/87  soft-tissue]
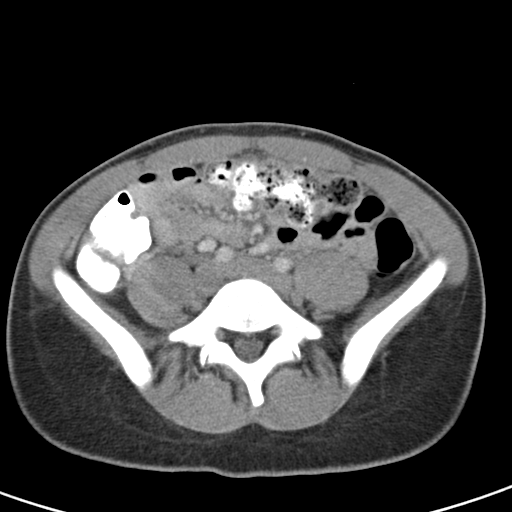
[im 45/87  soft-tissue]
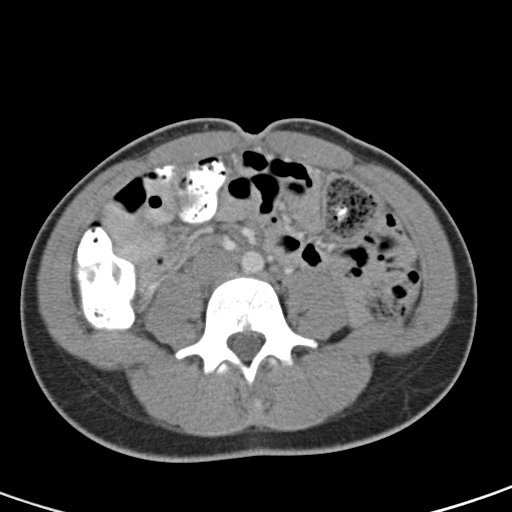
[im 49/87  soft-tissue]
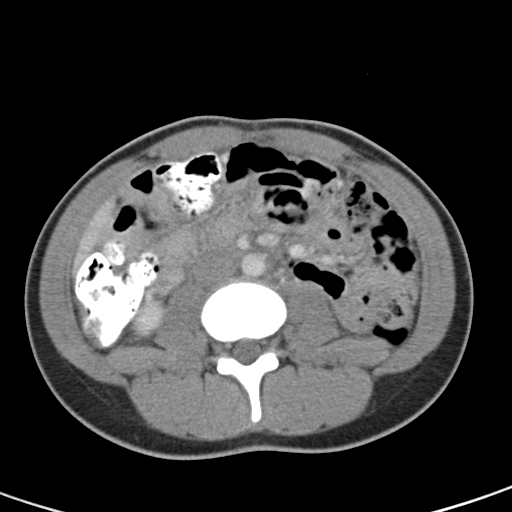
[im 56/87  soft-tissue]
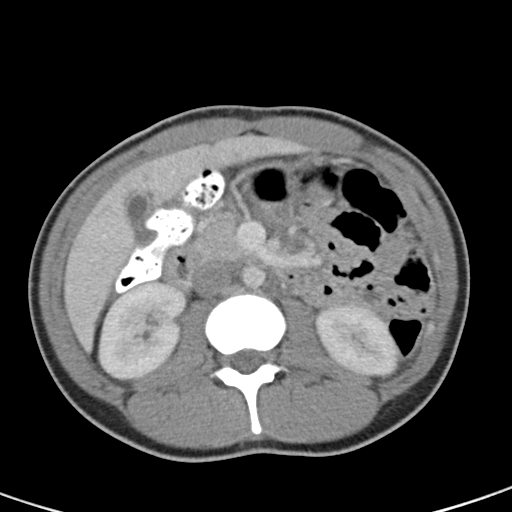
[im 56/87  bone]
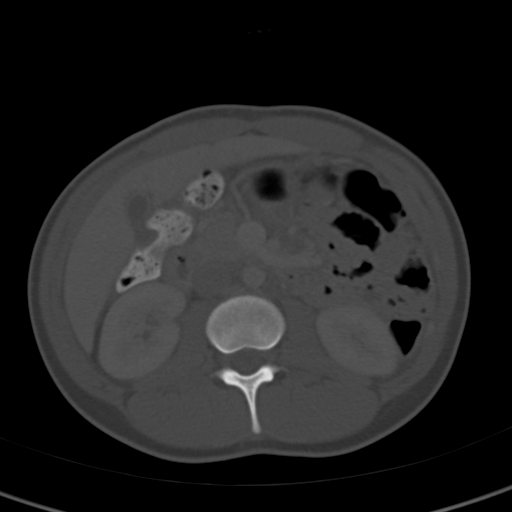
[im 62/87  soft-tissue]
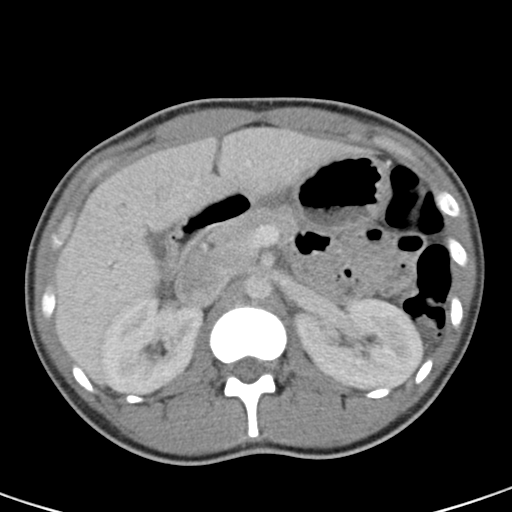
[im 69/87  soft-tissue]
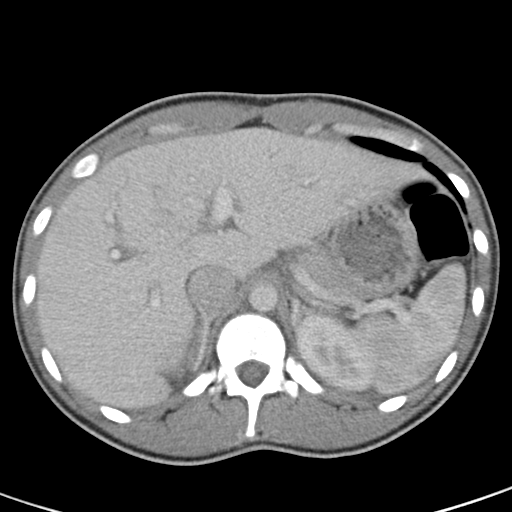
[im 76/87  soft-tissue]
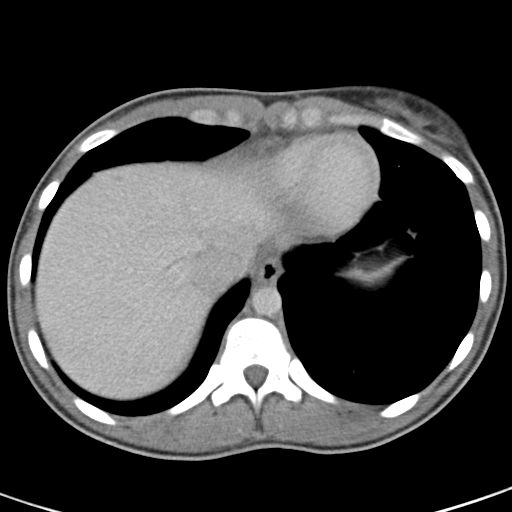
[im 83/87  soft-tissue]
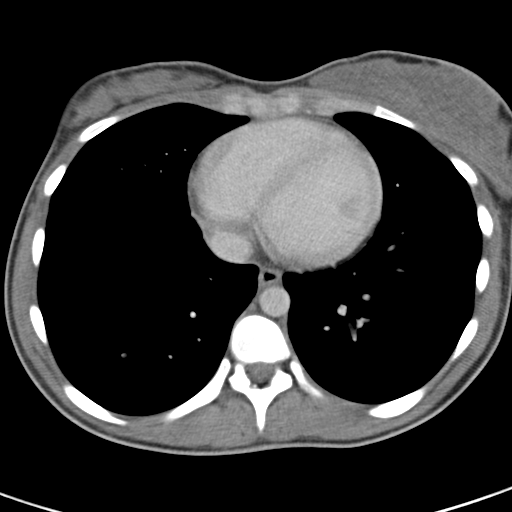

[Series 602: coronals · coronal · 0.85mm/px · 3 of 57 slices shown]
[im 19/57  soft-tissue]
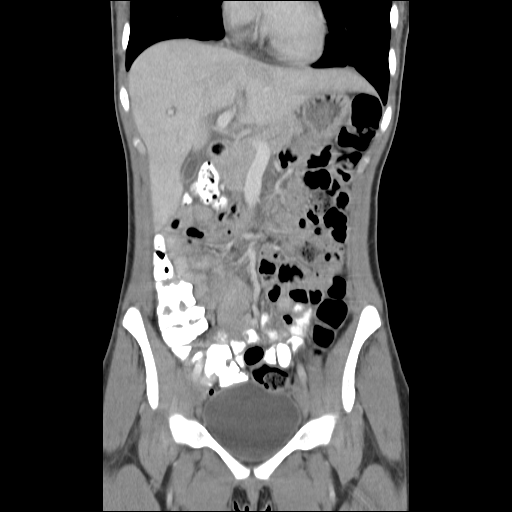
[im 25/57  soft-tissue]
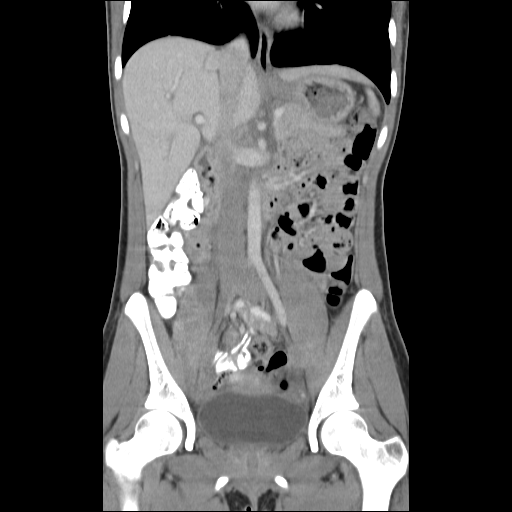
[im 32/57  soft-tissue]
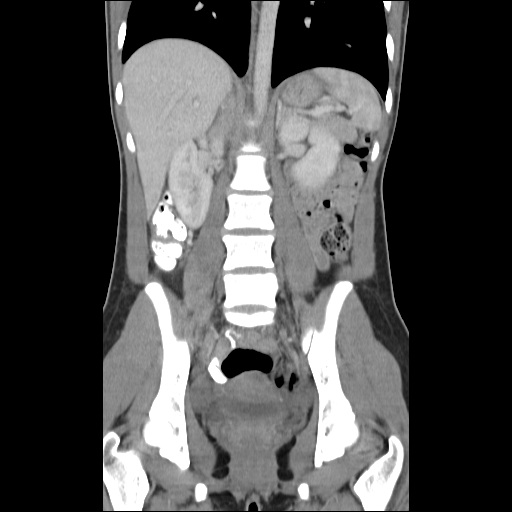

[16 of 46 positions shown; findings below may reference images not displayed]

FINDINGS: The visualized lung bases are clear.

The liver and spleen are unremarkable in appearance.  Mild
periportal edema likely reflects volume repletion.  Minimal
nonspecific hypodensity at the anterior hepatic dome is thought to
reflect fat underlying the diaphragm.

Trace fluid about the gallbladder is nonspecific given trace fluid
about the liver.  Trace fluid is also noted tracking along the
right paracolic gutter into the pelvis.  The gallbladder is
relatively decompressed and otherwise unremarkable in appearance.
The pancreas and adrenal glands are unremarkable.  The kidneys are
within normal limits bilaterally; no hydronephrosis or perinephric
stranding is seen.

The small bowel is unremarkable in appearance.  The stomach is
within normal limits.  No acute vascular abnormalities are seen.
Mild apparent mesenteric haziness is nonspecific and may simply
reflect the lack of intraperitoneal fat.

The appendix remains normal in caliber and contains air, without
evidence for appendicitis; it runs directly adjacent to the right
side of the bladder.  Contrast progresses to the level of the
distal transverse colon.  The colon is partially filled with stool
and is unremarkable in appearance.

The bladder is moderately distended appears grossly unremarkable.
The uterus is within normal limits.  The ovaries are relatively
symmetric; no suspicious adnexal mass is seen.  The distribution of
a small amount of fluid within the pelvic cul-de-sac is likely
physiologic; there is no definite evidence of a ruptured cyst.  No
inguinal lymphadenopathy is seen.

No acute osseous abnormalities are identified.
IMPRESSION: 1.  No definite acute abnormalities identified within the abdomen
or pelvis.
2.  Mild apparent mesenteric haziness is nonspecific and may simply
reflect the lack of intraperitoneal fat.
3.  Small amount of fluid within the pelvis, tracking along the
right paracolic gutter and about the liver; fluid about the
gallbladder is nonspecific given trace fluid about the liver.
Gallbladder otherwise unremarkable in appearance.  Fluid within the
pelvis is likely physiologic in nature.

## 2014-02-28 ENCOUNTER — Emergency Department (HOSPITAL_COMMUNITY)
Admission: EM | Admit: 2014-02-28 | Discharge: 2014-02-28 | Disposition: A | Discharge status: Home / Self Care | Payer: Medicaid Other | Attending: Emergency Medicine | Admitting: Emergency Medicine

## 2014-02-28 ENCOUNTER — Encounter (HOSPITAL_COMMUNITY): Payer: Self-pay | Admitting: Emergency Medicine

## 2014-02-28 DIAGNOSIS — Z720 Tobacco use: Secondary | ICD-10-CM | POA: Insufficient documentation

## 2014-02-28 DIAGNOSIS — Z3202 Encounter for pregnancy test, result negative: Secondary | ICD-10-CM | POA: Insufficient documentation

## 2014-02-28 DIAGNOSIS — R109 Unspecified abdominal pain: Secondary | ICD-10-CM

## 2014-02-28 DIAGNOSIS — R112 Nausea with vomiting, unspecified: Secondary | ICD-10-CM | POA: Insufficient documentation

## 2014-02-28 DIAGNOSIS — R1084 Generalized abdominal pain: Secondary | ICD-10-CM | POA: Insufficient documentation

## 2014-02-28 DIAGNOSIS — K219 Gastro-esophageal reflux disease without esophagitis: Secondary | ICD-10-CM | POA: Insufficient documentation

## 2014-02-28 HISTORY — DX: Gastro-esophageal reflux disease without esophagitis: K21.9

## 2014-02-28 LAB — URINALYSIS, ROUTINE W REFLEX MICROSCOPIC
Bilirubin Urine: NEGATIVE
GLUCOSE, UA: NEGATIVE mg/dL
HGB URINE DIPSTICK: NEGATIVE
Ketones, ur: 15 mg/dL — AB
LEUKOCYTES UA: NEGATIVE
Nitrite: NEGATIVE
Protein, ur: NEGATIVE mg/dL
SPECIFIC GRAVITY, URINE: 1.03 (ref 1.005–1.030)
UROBILINOGEN UA: 0.2 mg/dL (ref 0.0–1.0)
pH: 6 (ref 5.0–8.0)

## 2014-02-28 LAB — COMPREHENSIVE METABOLIC PANEL
ALBUMIN: 3.9 g/dL (ref 3.5–5.2)
ALT: 14 U/L (ref 0–35)
ANION GAP: 16 — AB (ref 5–15)
AST: 21 U/L (ref 0–37)
Alkaline Phosphatase: 41 U/L (ref 39–117)
BUN: 9 mg/dL (ref 6–23)
CO2: 21 mEq/L (ref 19–32)
Calcium: 9.2 mg/dL (ref 8.4–10.5)
Chloride: 103 mEq/L (ref 96–112)
Creatinine, Ser: 0.63 mg/dL (ref 0.50–1.10)
GFR calc Af Amer: >90 mL/min (ref >90)
GFR calc non Af Amer: >90 mL/min (ref >90)
Glucose, Bld: 99 mg/dL (ref 70–99)
Potassium: 3.8 mEq/L (ref 3.7–5.3)
SODIUM: 140 meq/L (ref 137–147)
TOTAL PROTEIN: 7.1 g/dL (ref 6.0–8.3)
Total Bilirubin: 0.4 mg/dL (ref 0.3–1.2)

## 2014-02-28 LAB — CBC WITH DIFFERENTIAL/PLATELET
BASOS PCT: 0 % (ref 0–1)
Basophils Absolute: 0 10*3/uL (ref 0.0–0.1)
EOS ABS: 0 10*3/uL (ref 0.0–0.7)
Eosinophils Relative: 1 % (ref 0–5)
HCT: 41.2 % (ref 36.0–46.0)
Hemoglobin: 13.6 g/dL (ref 12.0–15.0)
Lymphocytes Relative: 17 % (ref 12–46)
Lymphs Abs: 0.9 10*3/uL (ref 0.7–4.0)
MCH: 29.8 pg (ref 26.0–34.0)
MCHC: 33 g/dL (ref 30.0–36.0)
MCV: 90.2 fL (ref 78.0–100.0)
Monocytes Absolute: 0.3 10*3/uL (ref 0.1–1.0)
Monocytes Relative: 5 % (ref 3–12)
NEUTROS PCT: 77 % (ref 43–77)
Neutro Abs: 4.1 10*3/uL (ref 1.7–7.7)
PLATELETS: 179 10*3/uL (ref 150–400)
RBC: 4.57 MIL/uL (ref 3.87–5.11)
RDW: 13.5 % (ref 11.5–15.5)
WBC: 5.3 10*3/uL (ref 4.0–10.5)

## 2014-02-28 LAB — POC URINE PREG, ED: PREG TEST UR: NEGATIVE

## 2014-02-28 LAB — LIPASE, BLOOD: Lipase: 39 U/L (ref 11–59)

## 2014-02-28 MED ORDER — GI COCKTAIL ~~LOC~~
30.0000 mL | Freq: Once | ORAL | Status: AC
  Administered 2014-02-28: 30 mL via ORAL
  Filled 2014-02-28: qty 30

## 2014-02-28 MED ORDER — ONDANSETRON HCL 4 MG PO TABS: 4.0000 mg | ORAL_TABLET | Freq: Four times a day (QID) | ORAL | Status: DC

## 2014-02-28 MED ORDER — SODIUM CHLORIDE 0.9 % IV BOLUS (SEPSIS)
1000.0000 mL | Freq: Once | INTRAVENOUS | Status: AC
  Administered 2014-02-28: 1000 mL via INTRAVENOUS

## 2014-02-28 MED ORDER — RANITIDINE HCL 150 MG PO TABS: 150.0000 mg | ORAL_TABLET | Freq: Two times a day (BID) | ORAL | Status: DC

## 2014-02-28 NOTE — Discharge Instructions (Signed)
Abdominal Pain, Women °Abdominal (stomach, pelvic, or belly) pain can be caused by many things. It is important to tell your doctor: °· The location of the pain. °· Does it come and go or is it present all the time? °· Are there things that start the pain (eating certain foods, exercise)? °· Are there other symptoms associated with the pain (fever, nausea, vomiting, diarrhea)? °All of this is helpful to know when trying to find the cause of the pain. °CAUSES  °· Stomach: virus or bacteria infection, or ulcer. °· Intestine: appendicitis (inflamed appendix), regional ileitis (Crohn's disease), ulcerative colitis (inflamed colon), irritable bowel syndrome, diverticulitis (inflamed diverticulum of the colon), or cancer of the stomach or intestine. °· Gallbladder disease or stones in the gallbladder. °· Kidney disease, kidney stones, or infection. °· Pancreas infection or cancer. °· Fibromyalgia (pain disorder). °· Diseases of the female organs: °¨ Uterus: fibroid (non-cancerous) tumors or infection. °¨ Fallopian tubes: infection or tubal pregnancy. °¨ Ovary: cysts or tumors. °¨ Pelvic adhesions (scar tissue). °¨ Endometriosis (uterus lining tissue growing in the pelvis and on the pelvic organs). °¨ Pelvic congestion syndrome (female organs filling up with blood just before the menstrual period). °¨ Pain with the menstrual period. °¨ Pain with ovulation (producing an egg). °¨ Pain with an IUD (intrauterine device, birth control) in the uterus. °¨ Cancer of the female organs. °· Functional pain (pain not caused by a disease, may improve without treatment). °· Psychological pain. °· Depression. °DIAGNOSIS  °Your doctor will decide the seriousness of your pain by doing an examination. °· Blood tests. °· X-rays. °· Ultrasound. °· CT scan (computed tomography, special type of X-ray). °· MRI (magnetic resonance imaging). °· Cultures, for infection. °· Barium enema (dye inserted in the large intestine, to better view it with  X-rays). °· Colonoscopy (looking in intestine with a lighted tube). °· Laparoscopy (minor surgery, looking in abdomen with a lighted tube). °· Major abdominal exploratory surgery (looking in abdomen with a large incision). °TREATMENT  °The treatment will depend on the cause of the pain.  °· Many cases can be observed and treated at home. °· Over-the-counter medicines recommended by your caregiver. °· Prescription medicine. °· Antibiotics, for infection. °· Birth control pills, for painful periods or for ovulation pain. °· Hormone treatment, for endometriosis. °· Nerve blocking injections. °· Physical therapy. °· Antidepressants. °· Counseling with a psychologist or psychiatrist. °· Minor or major surgery. °HOME CARE INSTRUCTIONS  °· Do not take laxatives, unless directed by your caregiver. °· Take over-the-counter pain medicine only if ordered by your caregiver. Do not take aspirin because it can cause an upset stomach or bleeding. °· Try a clear liquid diet (broth or water) as ordered by your caregiver. Slowly move to a bland diet, as tolerated, if the pain is related to the stomach or intestine. °· Have a thermometer and take your temperature several times a day, and record it. °· Bed rest and sleep, if it helps the pain. °· Avoid sexual intercourse, if it causes pain. °· Avoid stressful situations. °· Keep your follow-up appointments and tests, as your caregiver orders. °· If the pain does not go away with medicine or surgery, you may try: °¨ Acupuncture. °¨ Relaxation exercises (yoga, meditation). °¨ Group therapy. °¨ Counseling. °SEEK MEDICAL CARE IF:  °· You notice certain foods cause stomach pain. °· Your home care treatment is not helping your pain. °· You need stronger pain medicine. °· You want your IUD removed. °· You feel faint or   lightheaded. °· You develop nausea and vomiting. °· You develop a rash. °· You are having side effects or an allergy to your medicine. °SEEK IMMEDIATE MEDICAL CARE IF:  °· Your  pain does not go away or gets worse. °· You have a fever. °· Your pain is felt only in portions of the abdomen. The right side could possibly be appendicitis. The left lower portion of the abdomen could be colitis or diverticulitis. °· You are passing blood in your stools (bright red or black tarry stools, with or without vomiting). °· You have blood in your urine. °· You develop chills, with or without a fever. °· You pass out. °MAKE SURE YOU:  °· Understand these instructions. °· Will watch your condition. °· Will get help right away if you are not doing well or get worse. °Document Released: 01/14/2007 Document Revised: 08/03/2013 Document Reviewed: 02/03/2009 °ExitCare® Patient Information ©2015 [employer]. This information is not intended to replace advice given to you by your health care provider. Make sure you discuss any questions you have with your health care provider. ° °Emergency Department Resource Guide °1) Find a Doctor and Pay Out of Pocket °Although you won't have to find out who is covered by your insurance plan, it is a good idea to ask around and get recommendations. You will then need to call the office and see if the doctor you have chosen will accept you as a new patient and what types of options they offer for patients who are self-pay. Some doctors offer discounts or will set up payment plans for their patients who do not have insurance, but you will need to ask so you aren't surprised when you get to your appointment. ° °2) Contact Your Local Health Department °Not all health departments have doctors that can see patients for sick visits, but many do, so it is worth a call to see if yours does. If you don't know where your local health department is, you can check in your phone book. The CDC also has a tool to help you locate your state's health department, and many state websites also have listings of all of their local health departments. ° °3) Find a Walk-in Clinic °If your illness is  not likely to be very severe or complicated, you may want to try a walk in clinic. These are popping up all over the country in pharmacies, drugstores, and shopping centers. They're usually staffed by nurse practitioners or physician assistants that have been trained to treat common illnesses and complaints. They're usually fairly quick and inexpensive. However, if you have serious medical issues or chronic medical problems, these are probably not your best option. ° °No Primary Care Doctor: °- Call Health Connect at  832-8000 - they can help you locate a primary care doctor that  accepts your insurance, provides certain services, etc. °- Physician Referral Service- 1-800-533-3463 ° °Chronic Pain Problems: °Organization         Address  Phone   Notes  °[facility]  [phone] Patients need to be referred by their primary care doctor.  ° °Medication Assistance: °Organization         Address  Phone   Notes  °[place] [facility] [address] °[place], [place] [address] [phone] --Must be a resident of [place] °-- Must have NO insurance coverage whatsoever (no Medicaid/ Medicare, etc.) °-- The pt. MUST have a primary care doctor that directs their care regularly   regularly and follows them in the community   [facility]  [phone]   [employer]  [phone]    Agencies that provide inexpensive medical care: Organization         Address  Phone   Notes  [place]  [phone]   [facility]    [phone]   [facility] [place], [place] [phone]   [place] [address], [place] [phone]   Planned Parenthood    [phone]   [facility]    [phone]   [place] [address], [place] Phone:  [phone], Fax:  [phone] Hours of Operation:  9 am - 6  pm, M-F.  Also accepts Medicaid/Medicare and self-pay.  [facility] [place], [address], [place] Phone: [phone], Fax: [phone]. Hours of Operation:  8:30 am - 5:30 pm, M-F.  Also accepts Medicaid and self-pay.  [facility] [place] [address] [place] Phone: [phone]   [place], [place], [place] [phone], Ext. [phone] Mondays & Thursdays: 7-9 AM.  First 15 patients are seen on a first come, first serve basis.    [place] Providers:  Organization         Address  Phone   Notes  [facility] [address], [place] [phone] Also accepts self-pay patients.  [facility] [address], [place]  [phone]   [place], [address], [place] [phone]   [facility] [address], [place] [phone]   Lucianne Lei [address], [place]   [phone] Only accepts [place] Access [place] patients after they have their name applied to their card.   Self-Pay (no insurance) in [facility]:  Organization         Address  Phone   Notes  Sickle Cell Patients, [facility] [place] [phone]   [facility] [place] [phone]   [facility] [place]  [place], [place], [place] [phone]   [facility]/Dr. Osei-Bonsu  [address] [place] or [address], [place] [phone] Phone number for both [place] and [place] locations is the same.  [facility] [address], [place] [phone]   [facility] [address], [place] or [address] [phone]   [facility] [address] [place] [phone], phone; [phone], fax Sees patients 1st and 3rd Saturday of every month.  Must not qualify for public or private insurance (i.e. Medicaid, Medicare, [place] Health Choice, Veterans' Benefits)  Household income should be no more than 200% of the poverty level The clinic cannot treat you if you are pregnant or think you are pregnant  Sexually transmitted diseases are not treated at the clinic.    Dental Care: Organization         Address  Phone  Notes  [facility] [address] [place], [place] [phone] Accepts children up to age 11  who are enrolled in Medicaid or [place] Health Choice; pregnant women with a Medicaid card; and children who have applied for Medicaid or [place] Health Choice, but were declined, whose parents can pay a reduced fee at time of service.  [facility]  [address], [place] [phone] Accepts children up to age 50 who are enrolled in [place] or [place]; pregnant women with a Medicaid card; and children who have applied for Medicaid or [place] Health Choice, but were declined, whose parents can pay a reduced fee at time of service.  [place] Adult Dental Access PROGRAM  [place] [phone] Patients are seen by appointment only. Walk-ins are not accepted. [place] will see patients 34 years of age and older. Monday - Tuesday (8am-5pm) Most Wednesdays (8:30-5pm) $30 per visit, cash only  [facility] Adult Dental Access PROGRAM  [address], [facility] [phone] Patients are seen by appointment only. Walk-ins are not accepted. [place] will see patients 55 years of age and older. One Wednesday Evening (Monthly: Volunteer Based).  $30 per visit, cash only  [place]  [phone] for adults; Children under age 55, call Graduate Pediatric Dentistry at [phone]. Children aged 46-14, please call [phone] to request a pediatric application.  Dental services are provided in all areas of dental care including fillings, crowns and bridges, complete and partial dentures, implants, gum treatment, root canals, and extractions. Preventive care is also provided. Treatment is provided to both adults and children. Patients are selected via a lottery and there is often a waiting list.   [facility] [address] [place]  [phone] [email or web address]   [facility] [address] [place], [place] [phone], Ext. [phone] Second and Fourth Thursday of each month, opens at 6:30 AM; Clinic ends at 9 AM.  Patients are seen on a first-come first-served basis, and a limited number are seen during each clinic.   [facility]  [address] [place] D[place]er [place], [place] [phone]   Eligibility Requirements You must have lived in [place], [place], or [place] counties for at least the last three months.   You cannot be eligible for state or federal sponsored [employer], including [employer], [place], or [employer].   You generally cannot be eligible for healthcare insurance through your employer.    How to apply: Eligibility screenings are held every Tuesday and Wednesday afternoon from 1:00 pm until 4:00 pm. You do not need an appointment for the interview!  [facility] [address], [place], [place]   [place]  [place] Department  [place]  [phone]    Behavioral Health Resources in the Community: Intensive Outpatient Programs Organization         Address  Phone  Notes  [place]. [address] [place], [place] [phone]   [facility] Outpatient [address], [place], [place]   ADS: Alcohol & Drug Svcs  [address], [place], [place]   [place] [address],  [place], [place] or [phone]   Substance Abuse Resources Organization         Address  Phone  Notes  [facility]  [phone]   [facility]  [phone]   [facility]  [phone]   [facility]  [phone]   Residential & Outpatient Substance Abuse Program  [phone]   Psychological Services Organization         Address  Phone  Notes  [facility]  [phone]- [phone]   [facility]  [phone]- [phone]   [facility] [address], [place] [phone] or [phone]    Mobile Crisis Teams Organization         Address  Phone  Notes  [facility], Mobile Crisis Care Unit  [phone]   [facility]  [address] [place], [place] [phone]   Doristine LocksSharon DeEsch [address] [place] [phone]    Self-Help/Support Groups Organization         Address  Phone             Notes  [facility] of [place] - variety of support groups  336- [phone] Call for more information  Narcotics Anonymous (NA), [facility] [address], [place]  2 meetings at this location   Statisticianesidential Treatment Programs Organization         Address  Phone  Notes  [facility] [address],    [place]  [phone]   [facility]  [address], [place] [phone], [place], [place] [phone]   [facility] [address], [place] [phone] Admissions: 8am-3pm M-F  [facility] [address]    [place], [place] [phone]   [facility] [address], [place], [place] [phone]   [facility] [address]  [place], [place] [phone]   [facility] - Intensive Outpatient [address], [place], [place]  [phone]   [facility] ([facility] [address]  [place], [place] [phone] or [phone]   [facility] ([facility]) [address] [place], [place] [phone] Accepts Medicaid  [facility] [address]  [place] [phone] Substance Abuse/Addiction Treatment   [facility]al [facility] Resources Organization         Address  Phone  Notes  [facility]  [phone]) [phone]   Angie FavaJulie Brannon, PhD [address], [address] [place], [place]   [phone] or [phone](336) [phone]   [facility]   [address] [place], [place] [phone]   [facility] [address], [place], [place] [phone]) [phone] Insurance/Medicaid/sponsorship through [facility]  [facility] [address]                                    [place], [place] [phone] Therapy/tele-psych/case  [facility] [address].   [place], [place] [phone]    Dr. Lolly MustacheArfeen  [phone]   [facility] [place]  [employer] [facility] 1) [address], [place] 2) [address], [place] 3)  [address], [place] [phone](336) [phone]  [phone]   [facility] Child Abuse Hotline [phone]) [phone] or [phone](336) [phone] (After Hours)      Your evaluated in the ED today for your nausea and abdominal discomfort. We were not able to find an emergent source for your symptoms. Your nausea resolved with a GI cocktail. You will be prescribed Zofran for any nausea you may experienced in  the future. He will also be prescribed Zantac which you may take for any heartburn or GERD symptoms you may experience. Please follow-up with health and wellness in order to establish care. Return to ED for worsening symptoms

## 2014-02-28 NOTE — ED Notes (Signed)
Pt reports that she has been experiencing N/V/D since last Thursday. Pt reports pain to her umbilical area. NAD at this time.

## 2014-02-28 NOTE — ED Provider Notes (Signed)
CSN: [id number]     Arrival date & time 02/28/14  1011 History   First MD Initiated Contact with Patient 02/28/14 1030     Chief Complaint  Patient presents with  . Abdominal Pain  . Nausea     (Consider location/radiation/quality/duration/timing/severity/associated sxs/prior Treatment) HPI Michele Yoder is a 23 y.o. female with a history of GERD comes in for evaluation of nausea and vomiting who patient states on Thanksgiving evening, she became nauseous and vomited as well as other members of her family. She states that resolved and Saturday evening she had some "really sweet Kool-Aid" they caused her to become nauseous again. She denies any nausea or vomiting right now "but it is coming". She reports mild abdominal discomfort around her bellybutton, but is diffuse when she vomits. She reports fevers at home, did not measure them but "felt shaky after I threw up,that's how I know have a fever". She denies any other symptoms, no headaches, myalgias, dysuria, hematuria, flank pain, vaginal bleeding or discharge, diarrhea or constipation. Nothing makes her problem better or worse. There are no other modifying factors She also reports GERD, but does not take medication because " it is too expensive ".  Past Medical History  Diagnosis Date  . GERD (gastroesophageal reflux disease)    Past Surgical History  Procedure Laterality Date  . Tonsillectomy    . Adenoidectomy     No family history on file. History  Substance Use Topics  . Smoking status: Current Every Day Smoker -- 0.50 packs/day    Types: Cigarettes  . Smokeless tobacco: Not on file  . Alcohol Use: Yes     Comment: occasional    OB History    No data available     Review of Systems  Constitutional: Negative for fever.  HENT: Negative for sore throat.   Eyes: Negative for visual disturbance.  Respiratory: Negative for shortness of breath.   Cardiovascular: Negative for chest pain.  Gastrointestinal: Positive for  nausea, vomiting and abdominal pain.  Endocrine: Negative for polyuria.  Genitourinary: Negative for dysuria.  Skin: Negative for rash.  Neurological: Negative for headaches.      Allergies  Review of patient's allergies indicates no known allergies.  Home Medications   Prior to Admission medications   Medication Sig Start Date End Date Taking? Authorizing Provider  ibuprofen (ADVIL,MOTRIN) 200 MG tablet Take 800 mg by mouth every 6 (six) hours as needed for pain.    Yes Historical Provider, MD  ondansetron (ZOFRAN) 4 MG tablet Take 1 tablet (4 mg total) by mouth every 6 (six) hours. 02/28/14   Earle GellBenjamin W Ashlei Chinchilla, PA-C  ranitidine (ZANTAC) 150 MG tablet Take 1 tablet (150 mg total) by mouth 2 (two) times daily. 02/28/14   Earle GellBenjamin W Rosy Estabrook, PA-C   BP 107/69 mmHg  Pulse 93  Resp 16  Ht 5' (1.524 m)  Wt 105 lb (47.628 kg)  BMI 20.51 kg/m2  SpO2 98%  LMP 01/13/2014 Physical Exam  Constitutional: She is oriented to person, place, and time. She appears well-developed and well-nourished.  HENT:  Head: Normocephalic and atraumatic.  Mouth/Throat: Oropharynx is clear and moist.  Eyes: Conjunctivae are normal. Pupils are equal, round, and reactive to light. Right eye exhibits no discharge. Left eye exhibits no discharge. No scleral icterus.  Neck: Neck supple.  Cardiovascular: Normal rate, regular rhythm and normal heart sounds.   Pulmonary/Chest: Effort normal and breath sounds normal. No respiratory distress. She has no wheezes. She has no rales.  Abdominal: Soft. There is no tenderness.  Musculoskeletal: She exhibits no tenderness.  Neurological: She is alert and oriented to person, place, and time.  Cranial Nerves II-XII grossly intact  Skin: Skin is warm and dry. No rash noted.  Psychiatric: She has a normal mood and affect.  Nursing note and vitals reviewed.   ED Course  Procedures (including critical care time) Labs Review Labs Reviewed  COMPREHENSIVE METABOLIC PANEL  - Abnormal; Notable for the following:    Anion gap 16 (*)    All other components within normal limits  URINALYSIS, ROUTINE W REFLEX MICROSCOPIC - Abnormal; Notable for the following:    APPearance CLOUDY (*)    Ketones, ur 15 (*)    All other components within normal limits  CBC WITH DIFFERENTIAL  LIPASE, BLOOD  POC URINE PREG, ED    Imaging Review No results found.   EKG Interpretation None     Meds given in ED:  Medications  gi cocktail (Maalox,Lidocaine,Donnatal) (30 mLs Oral Given 02/28/14 1100)  sodium chloride 0.9 % bolus 1,000 mL (1,000 mLs Intravenous New Bag/Given 02/28/14 1100)    New Prescriptions   ONDANSETRON (ZOFRAN) 4 MG TABLET    Take 1 tablet (4 mg total) by mouth every 6 (six) hours.   RANITIDINE (ZANTAC) 150 MG TABLET    Take 1 tablet (150 mg total) by mouth 2 (two) times daily.   Filed Vitals:   02/28/14 1028  BP: 107/69  Pulse: 93  Resp: 16  Height: 5' (1.524 m)  Weight: 105 lb (47.628 kg)  SpO2: 98%    MDM  Michele Yoder is a 23 y.o. female with GERD who presents for evaluation of abdominal pain, nausea and vomiting  Vitals stable - WNL -afebrile Pt resting comfortably in ED. patient states GI cocktail made her feel much better. Nausea has resolved. Patient reports mild diffuse abdominal pain at this point PE--not concerning for other acute or emergent pathology. Benign abdominal exam with no tenderness Labwork noncontributory.  Will DC with Zofran for nausea, Zantac for any future GERD symptoms. Gave resource guide and discussed follow-up with health and wellness to establish PCP care, patient agrees. Discussed f/u with PCP and return precautions, pt very amenable to plan. Patient stable, in good condition and is appropriate for discharge   Final diagnoses:  Abdominal discomfort       Sharlene MottsBenjamin W Sade Mehlhoff, PA-C 02/28/14 1156  Arby BarretteMarcy Pfeiffer, MD 02/28/14 [id number]

## 2015-04-12 IMAGING — US US ART/VEN ABD/PELV/SCROTUM DOPPLER LTD
1 series · 14 of 25 positions shown · non-contrast
Comparison: None.

CLINICAL DATA: Abdominal cramping.  Question ovarian torsion.

TRANSABDOMINAL AND TRANSVAGINAL ULTRASOUND OF PELVIS
DOPPLER ULTRASOUND OF OVARIES
TECHNIQUE: Both transabdominal and transvaginal ultrasound
examinations of the pelvis were performed. Transabdominal technique
was performed for global imaging of the pelvis including uterus,
ovaries, adnexal regions, and pelvic cul-de-sac.
It was necessary to proceed with endovaginal exam following the
transabdominal exam to visualize the ovaries.
Color and duplex Doppler ultrasound was utilized to evaluate blood
flow to the ovaries.

[Series 1: us art/ven abd/pelv/scrotum doppler ltd · 0.25mm/px · 14 of 53 slices shown]
[im 1/53]
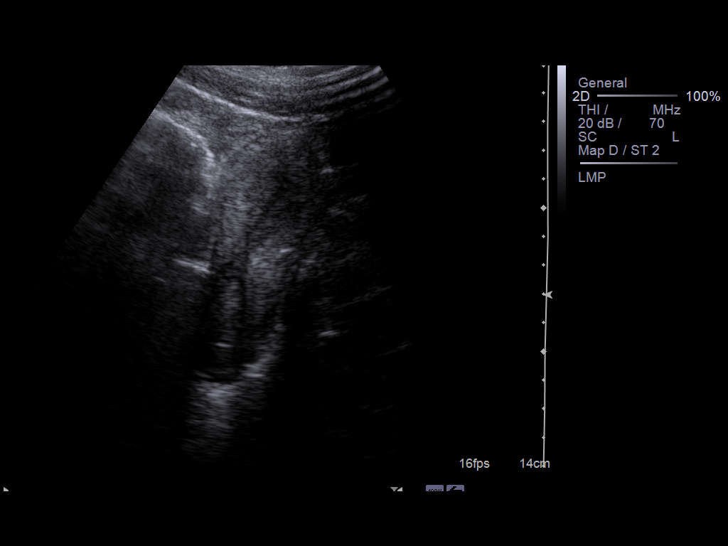
[im 5/53]
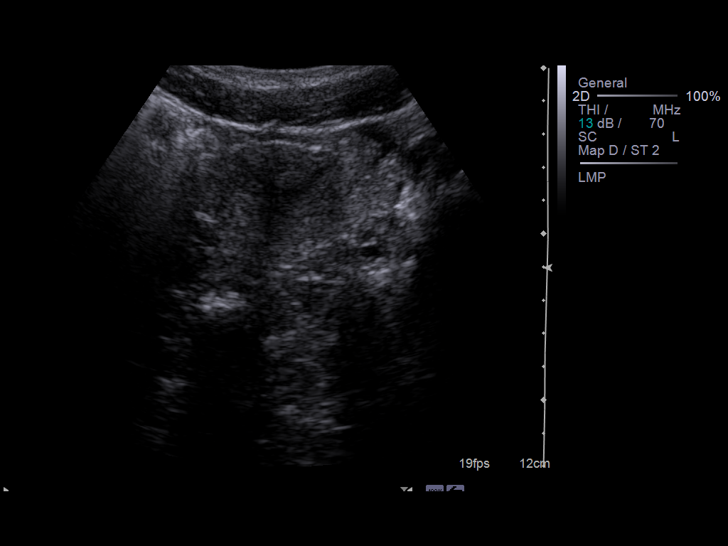
[im 9/53]
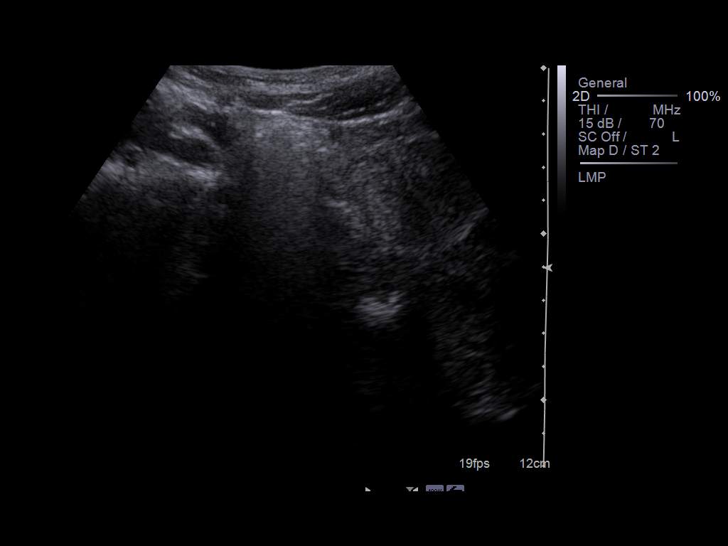
[im 14/53]
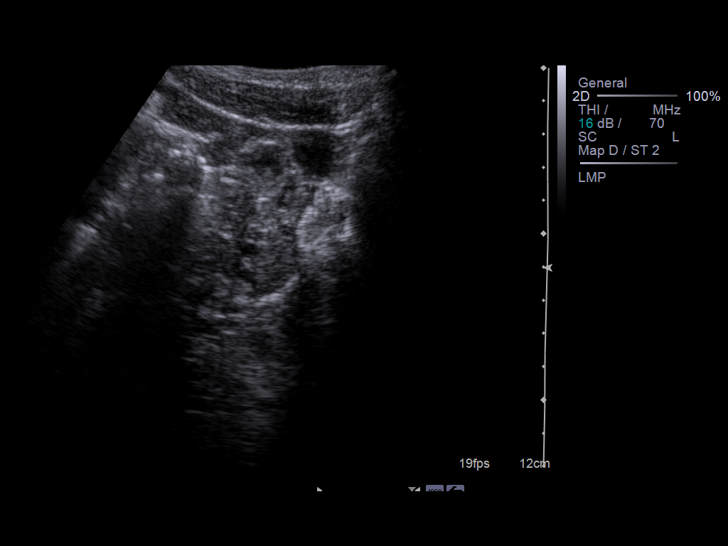
[im 18/53]
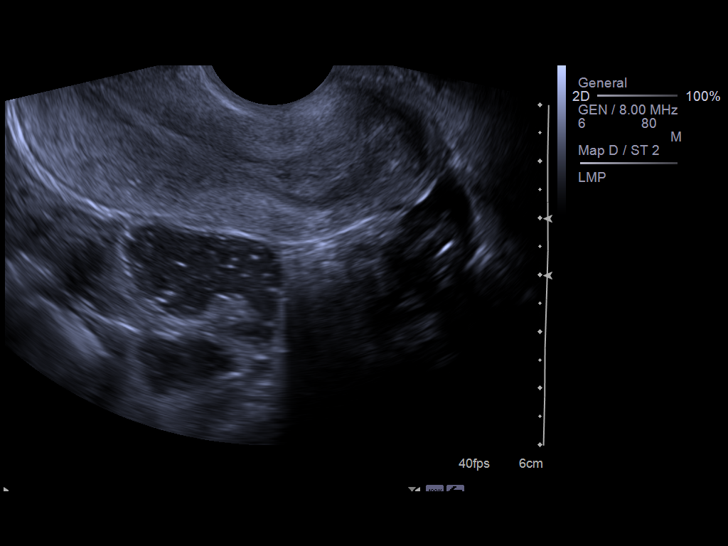
[im 20/53]
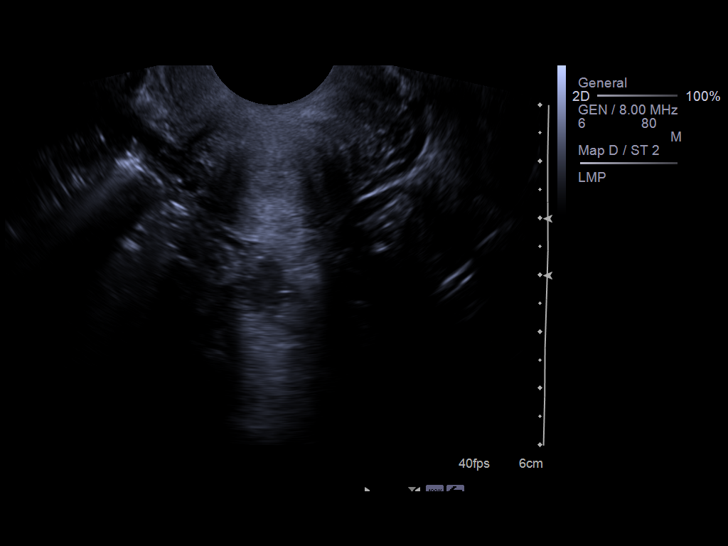
[im 24/53]
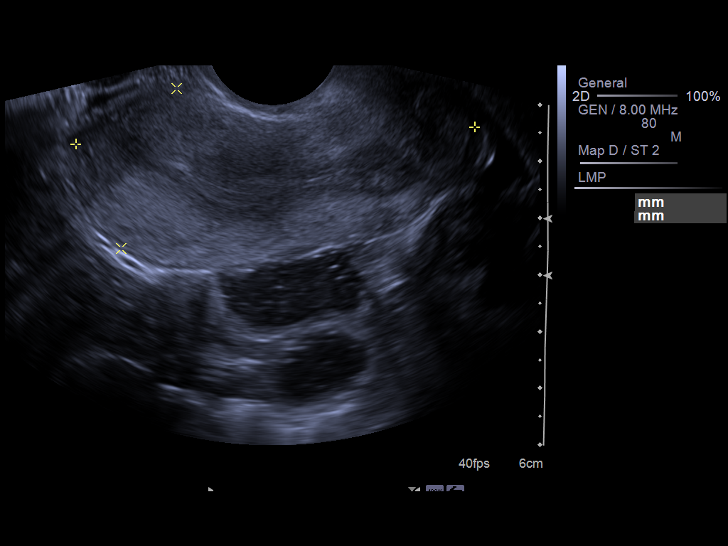
[im 29/53]
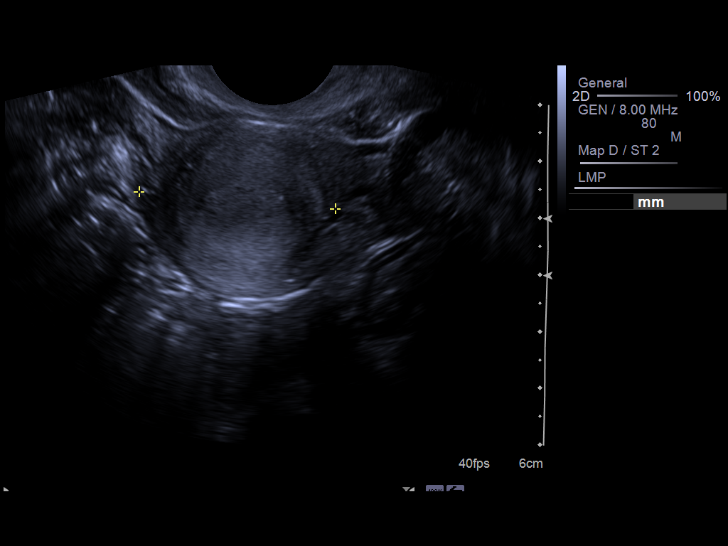
[im 33/53]
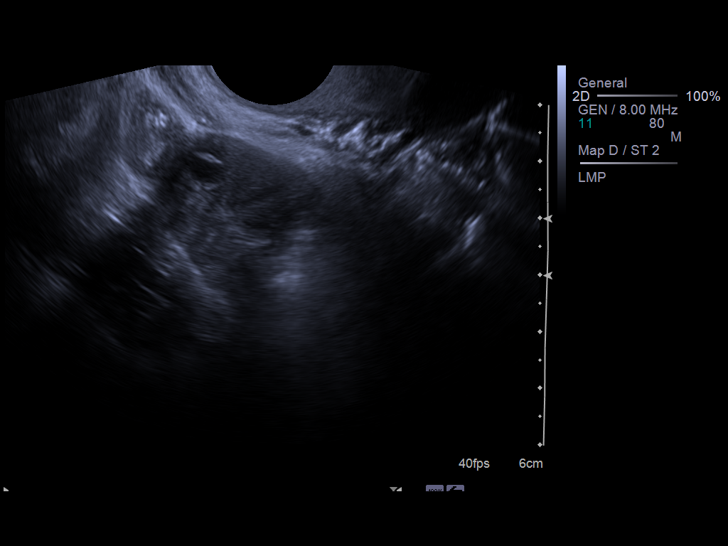
[im 35/53]
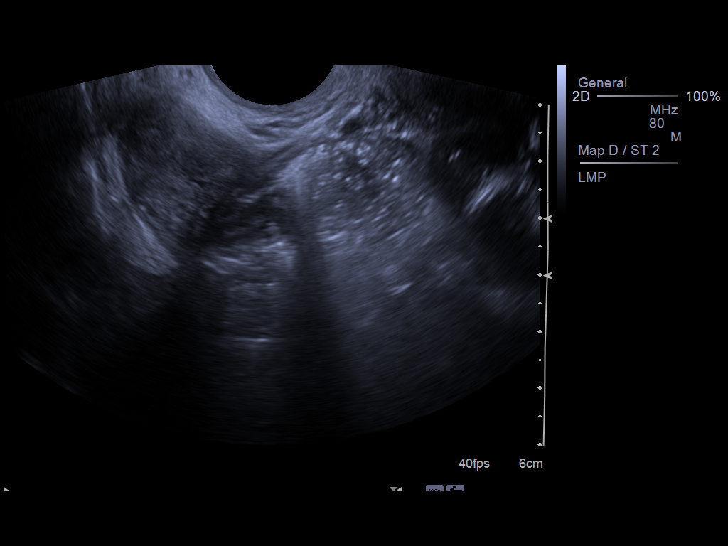
[im 40/53]
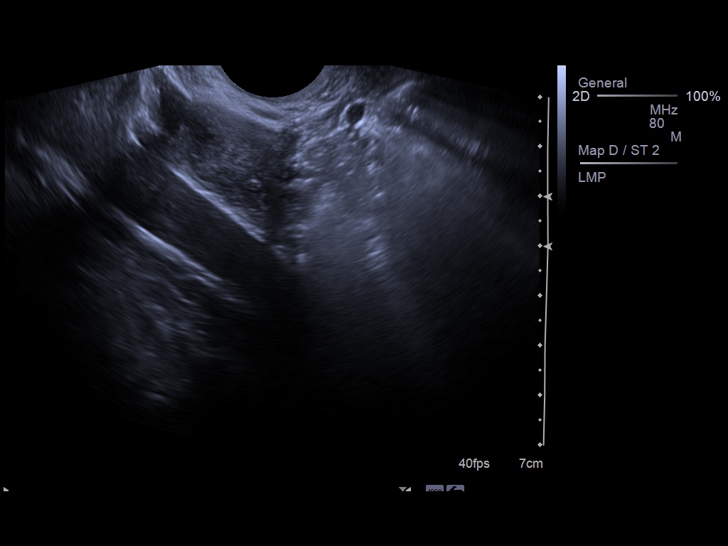
[im 44/53]
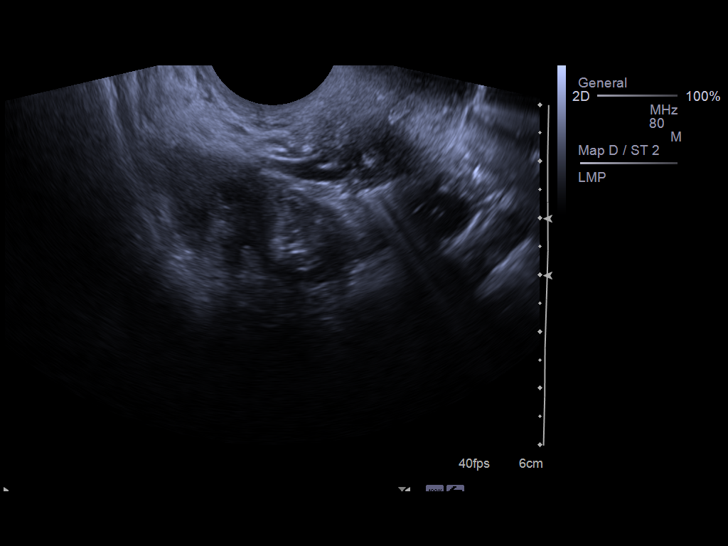
[im 48/53]
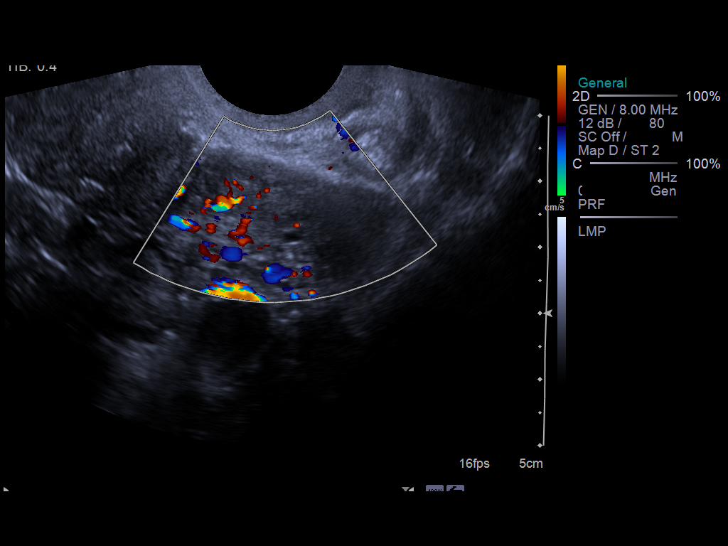
[im 53/53]
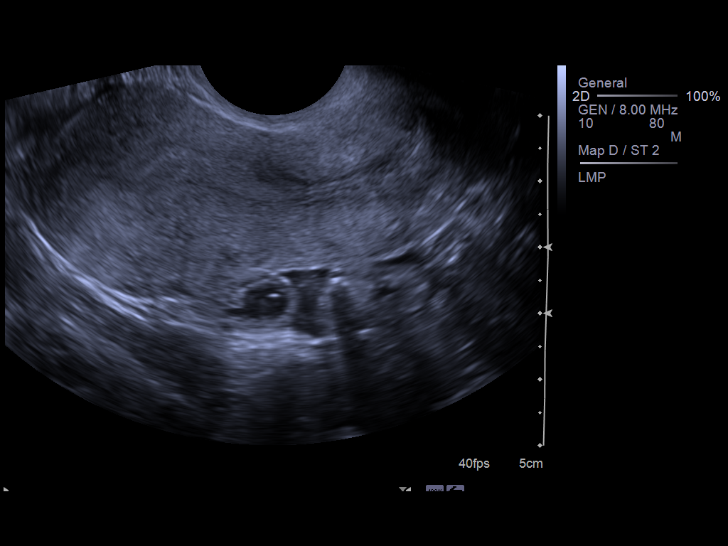

[14 of 25 positions shown; findings below may reference images not displayed]

FINDINGS

Uterus:  Measures 7.0 x 3.0 x 4.0 cm and is normal in parenchymal
echogenicity.

Endometrium:  Measures 4 mm, within normal limits.

Right ovary: Measures 3.1 x 2.5 x 2.4 cm, negative.

Left ovary: Measures 2.8 x 1.9 x 2.6 cm, negative.

Pulsed Doppler evaluation demonstrates normal low-resistance
arterial and venous waveforms in both ovaries.

Additional finding:  No free fluid.
IMPRESSION: Normal exam.  No evidence of pelvic mass or other significant
abnormality.

No sonographic evidence for ovarian torsion.

## 2016-10-02 ENCOUNTER — Encounter (HOSPITAL_COMMUNITY): Payer: Self-pay

## 2016-10-02 ENCOUNTER — Emergency Department (HOSPITAL_COMMUNITY)
Admission: EM | Admit: 2016-10-02 | Discharge: 2016-10-02 | Disposition: A | Discharge status: Home / Self Care | Payer: PRIVATE HEALTH INSURANCE | Attending: Emergency Medicine | Admitting: Emergency Medicine

## 2016-10-02 DIAGNOSIS — R102 Pelvic and perineal pain: Secondary | ICD-10-CM | POA: Diagnosis present

## 2016-10-02 DIAGNOSIS — Z79899 Other long term (current) drug therapy: Secondary | ICD-10-CM | POA: Insufficient documentation

## 2016-10-02 DIAGNOSIS — F17210 Nicotine dependence, cigarettes, uncomplicated: Secondary | ICD-10-CM | POA: Insufficient documentation

## 2016-10-02 DIAGNOSIS — K219 Gastro-esophageal reflux disease without esophagitis: Secondary | ICD-10-CM | POA: Diagnosis not present

## 2016-10-02 LAB — COMPREHENSIVE METABOLIC PANEL
ALK PHOS: 40 U/L (ref 38–126)
ALT: 12 U/L — ABNORMAL LOW (ref 14–54)
AST: 19 U/L (ref 15–41)
Albumin: 3.5 g/dL (ref 3.5–5.0)
Anion gap: 9 (ref 5–15)
BUN: 7 mg/dL (ref 6–20)
CALCIUM: 8.9 mg/dL (ref 8.9–10.3)
CHLORIDE: 106 mmol/L (ref 101–111)
CO2: 22 mmol/L (ref 22–32)
CREATININE: 0.76 mg/dL (ref 0.44–1.00)
GFR calc Af Amer: >60 mL/min (ref >60)
GFR calc non Af Amer: >60 mL/min (ref >60)
Glucose, Bld: 125 mg/dL — ABNORMAL HIGH (ref 65–99)
Potassium: 3.5 mmol/L (ref 3.5–5.1)
SODIUM: 137 mmol/L (ref 135–145)
Total Bilirubin: 0.4 mg/dL (ref 0.3–1.2)
Total Protein: 5.9 g/dL — ABNORMAL LOW (ref 6.5–8.1)

## 2016-10-02 LAB — I-STAT BETA HCG BLOOD, ED (MC, WL, AP ONLY): I-stat hCG, quantitative: <5 m[IU]/mL (ref <5)

## 2016-10-02 LAB — URINALYSIS, ROUTINE W REFLEX MICROSCOPIC
Bilirubin Urine: NEGATIVE
Glucose, UA: NEGATIVE mg/dL
Hgb urine dipstick: NEGATIVE
Ketones, ur: NEGATIVE mg/dL
Nitrite: NEGATIVE
PH: 6 (ref 5.0–8.0)
Protein, ur: NEGATIVE mg/dL
SPECIFIC GRAVITY, URINE: 1.026 (ref 1.005–1.030)

## 2016-10-02 LAB — CBC
HCT: 38 % (ref 36.0–46.0)
Hemoglobin: 12 g/dL (ref 12.0–15.0)
MCH: 28.7 pg (ref 26.0–34.0)
MCHC: 31.6 g/dL (ref 30.0–36.0)
MCV: 90.9 fL (ref 78.0–100.0)
Platelets: 167 10*3/uL (ref 150–400)
RBC: 4.18 MIL/uL (ref 3.87–5.11)
RDW: 13.8 % (ref 11.5–15.5)
WBC: 6.2 10*3/uL (ref 4.0–10.5)

## 2016-10-02 LAB — WET PREP, GENITAL
CLUE CELLS WET PREP: NONE SEEN
Sperm: NONE SEEN
Trich, Wet Prep: NONE SEEN
YEAST WET PREP: NONE SEEN

## 2016-10-02 NOTE — Discharge Instructions (Signed)
Please read and follow all provided instructions.  Your diagnoses today include:  1. Pelvic pain     Tests performed today include: Vital signs. See below for your results today.   Medications prescribed:  Take as prescribed   Home care instructions:  Follow any educational materials contained in this packet.  Follow-up instructions: Please follow-up with your OBGYN for further evaluation of symptoms and treatment   Return instructions:  Please return to the Emergency Department if you do not get better, if you get worse, or new symptoms OR  - Fever (temperature greater than 101.36F)  - Bleeding that does not stop with holding pressure to the area    -Severe pain (please note that you may be more sore the day after your accident)  - Chest Pain  - Difficulty breathing  - Severe nausea or vomiting  - Inability to tolerate food and liquids  - Passing out  - Skin becoming red around your wounds  - Change in mental status (confusion or lethargy)  - New numbness or weakness    Please return if you have any other emergent concerns.  Additional Information:  Your vital signs today were: BP 103/68 (BP Location: Right Arm)    Pulse 66    Temp 98.3 F (36.8 C) (Oral)    Resp 16    Ht 5' (1.524 m)    Wt 48.5 kg (107 lb)    LMP 08/24/2016 (Exact Date)    SpO2 100%    BMI 20.90 kg/m  If your blood pressure (BP) was elevated above 135/85 this visit, please have this repeated by your doctor within one month. ---------------

## 2016-10-02 NOTE — ED Notes (Signed)
Pelvic cart at pt's bedside. 

## 2016-10-02 NOTE — ED Triage Notes (Signed)
Pt states she was diagnosed with ovarian cyst last week. She reports today she was feeling fine and had sex and now has unbearable pain. LMP was 5/25 but pt has had negative preg tests. Pain is intermittent but cramping/sharp in nature.

## 2016-10-02 NOTE — ED Notes (Signed)
Pelvic exam done by Tyler - PA and Tieara Flitton - EMT assisted 

## 2016-10-02 NOTE — ED Provider Notes (Signed)
[facility]-EMERGENCY DEPT Provider Note   CSN: [id number] Arrival date & time: 10/02/16  1045     History   Chief Complaint Chief Complaint  Patient presents with  . Abdominal Pain    HPI Michele Yoder is a 26 y.o. female.  HPI 26 y.o. female presents to the Emergency Department today due to lower abdominal pain this morning after having intercourse. Seen last week and diagnosed with ovarian cyst via ultrasound. Pt states she was feeling fine after medications, but noted sharp onset of pain after intercourse around 10AM and reports unbearable pain. States the pain lasted x 1 hour and then dissipated. Asymptomatic currently. No N/V. No diarrhea. Denies vaginal bleeding or discharge. No dysuria. LMP 08-24-16. Pt with follow up with GYN on the 12th of this month. No other symptoms noted.    Past Medical History:  Diagnosis Date  . GERD (gastroesophageal reflux disease)     There are no active problems to display for this patient.   Past Surgical History:  Procedure Laterality Date  . ADENOIDECTOMY    . TONSILLECTOMY      OB History    No data available       Home Medications    Prior to Admission medications   Medication Sig Start Date End Date Taking? Authorizing Provider  ibuprofen (ADVIL,MOTRIN) 200 MG tablet Take 800 mg by mouth every 6 (six) hours as needed for pain.     [provider]  ondansetron (ZOFRAN) 4 MG tablet Take 1 tablet (4 mg total) by mouth every 6 (six) hours. 02/28/14   Cartner, Sharlet Salina, PA-C  ranitidine (ZANTAC) 150 MG tablet Take 1 tablet (150 mg total) by mouth 2 (two) times daily. 02/28/14   Joycie Peek, PA-C    Family History History reviewed. No pertinent family history.  Social History Social History  Substance Use Topics  . Smoking status: Current Every Day Smoker    Packs/day: 0.50    Types: Cigarettes  . Smokeless tobacco: Never Used  . Alcohol use Yes     Comment: occasional      Allergies   Patient has no  known allergies.   Review of Systems Review of Systems ROS reviewed and all are negative for acute change except as noted in the HPI.  Physical Exam Updated Vital Signs BP 110/70   Pulse 70   Temp 97.9 F (36.6 C) (Oral)   Resp 18   Ht 5' (1.524 m)   Wt 48.5 kg (107 lb)   LMP 08/24/2016 (Exact Date)   SpO2 100%   BMI 20.90 kg/m   Physical Exam  Constitutional: She is oriented to person, place, and time. Vital signs are normal. She appears well-developed and well-nourished.  NAD. Resting Comfortably   HENT:  Head: Normocephalic and atraumatic.  Right Ear: Hearing normal.  Left Ear: Hearing normal.  Eyes: Conjunctivae and EOM are normal. Pupils are equal, round, and reactive to light.  Neck: Normal range of motion. Neck supple.  Cardiovascular: Normal rate, regular rhythm, normal heart sounds and intact distal pulses.   Pulmonary/Chest: Effort normal and breath sounds normal.  Abdominal: Soft. Normal appearance and bowel sounds are normal. There is no tenderness. There is no rigidity, no rebound, no guarding, no CVA tenderness, no tenderness at McBurney's point and negative Murphy's sign.  Musculoskeletal: Normal range of motion.  Neurological: She is alert and oriented to person, place, and time.  Skin: Skin is warm and dry.  Psychiatric: She has a normal mood and  affect. Her speech is normal and behavior is normal. Thought content normal.  Nursing note and vitals reviewed.  Exam performed by Eston Estersyler M Twana Wileman,  exam chaperoned Date: 10/02/2016 Pelvic exam: normal external genitalia without evidence of trauma. VULVA: normal appearing vulva with no masses, tenderness or lesion. VAGINA: normal appearing vagina with normal color and discharge, no lesions. CERVIX: normal appearing cervix without lesions, cervical motion tenderness absent, cervical os closed with out purulent discharge; vaginal discharge - copious and creamy, Wet prep and DNA probe for chlamydia and GC obtained.     ADNEXA: normal adnexa in size, nontender and no masses UTERUS: uterus is normal size, shape, consistency and nontender.   ED Treatments / Results  Labs (all labs ordered are listed, but only abnormal results are displayed) Labs Reviewed  WET PREP, GENITAL - Abnormal; Notable for the following:       Result Value   WBC, Wet Prep HPF POC MANY (*)    All other components within normal limits  COMPREHENSIVE METABOLIC PANEL - Abnormal; Notable for the following:    Glucose, Bld 125 (*)    Total Protein 5.9 (*)    ALT 12 (*)    All other components within normal limits  URINALYSIS, ROUTINE W REFLEX MICROSCOPIC - Abnormal; Notable for the following:    Color, Urine AMBER (*)    Leukocytes, UA SMALL (*)    Bacteria, UA RARE (*)    Squamous Epithelial / LPF 6-30 (*)    All other components within normal limits  CBC  I-STAT BETA HCG BLOOD, ED ([facility], [facility], [facility] ONLY)  GC/CHLAMYDIA PROBE AMP ([place] NOT [facility]    EKG  EKG Interpretation None       Radiology No results found.  Procedures Procedures (including critical care time)  Medications Ordered in ED Medications - No data to display   Initial Impression / Assessment and Plan / ED Course  I have reviewed the triage vital signs and the nursing notes.  Pertinent labs & imaging results that were available during my care of the patient were reviewed by me and considered in my medical decision making (see chart for details).  Final Clinical Impressions(s) / ED Diagnoses  {I have reviewed and evaluated the relevant laboratory values.   {I have reviewed the relevant previous healthcare records.  {I obtained HPI from historian.   ED Course:  Assessment: Pt is a 26 y.o. female presents to the Emergency Department today due to lower abdominal pain this morning after having intercourse. Seen last week and diagnosed with ovarian cyst via ultrasound. Pt states she was feeling fine after medications, but noted sharp onset of pain  after intercourse around 10AM and reports unbearable pain. States the pain lasted x 1 hour and then dissipated. Asymptomatic currently. No N/V. No diarrhea. Denies vaginal bleeding or discharge. No dysuria. LMP 08-24-16. Pt with follow up with GYN on the 12th of this month. On exam, pt in NAD. Nontoxic/nonseptic appearing. VSS. Afebrile. Lungs CTA. Heart RRR. Abdomen nontender soft. GU Exam unremarkable. Mild discharge. No Adnexal. No CMT. Wet prep unremarkable. GC obtained. UA unremarkable. Pregnancy negative. Likely ruptured ovarian cyst that resolved. Doubt torsion given exam. Pt asymptomatic currently. Plan is to DC home with follow up to GYN. At time of discharge, Patient is in no acute distress. Vital Signs are stable. Patient is able to ambulate. Patient able to tolerate PO.   Disposition/Plan:  DC Home Additional Verbal discharge instructions given and discussed with patient.  Pt Instructed to f/u with PCP in the next week for evaluation and treatment of symptoms. Return precautions given Pt acknowledges and agrees with plan  Supervising Physician Mancel Bale, MD  Final diagnoses:  Pelvic pain    New Prescriptions New Prescriptions   No medications on file     Audry Pili, Cordelia Poche 10/02/16 1640    Mancel Bale, MD 10/03/16 [phone]

## 2016-10-04 ENCOUNTER — Ambulatory Visit: Payer: Medicaid Other | Admitting: Advanced Practice Midwife

## 2016-10-04 LAB — GC/CHLAMYDIA PROBE AMP (~~LOC~~) NOT AT ARMC
Chlamydia: NEGATIVE
NEISSERIA GONORRHEA: NEGATIVE

## 2016-12-20 ENCOUNTER — Ambulatory Visit: Payer: PRIVATE HEALTH INSURANCE | Admitting: Neurology

## 2016-12-20 ENCOUNTER — Telehealth: Payer: Self-pay | Admitting: Neurology

## 2016-12-20 NOTE — Telephone Encounter (Signed)
This patient did not show for a new patient appointment today. 

## 2016-12-21 ENCOUNTER — Encounter: Payer: Self-pay | Admitting: Neurology

## 2017-02-05 DIAGNOSIS — Z0289 Encounter for other administrative examinations: Secondary | ICD-10-CM

## 2017-04-11 ENCOUNTER — Ambulatory Visit: Payer: PRIVATE HEALTH INSURANCE | Admitting: Neurology

## 2017-04-11 ENCOUNTER — Encounter: Payer: Self-pay | Admitting: Neurology

## 2017-04-11 ENCOUNTER — Telehealth: Payer: Self-pay | Admitting: Neurology

## 2017-04-11 NOTE — Telephone Encounter (Signed)
This is the second new patient no show for this patient.  The patient will be discharged from our practice. 

## 2018-06-10 ENCOUNTER — Ambulatory Visit: Payer: PRIVATE HEALTH INSURANCE | Admitting: Gastroenterology

## 2023-11-08 LAB — OB RESULTS CONSOLE RPR: RPR: NONREACTIVE

## 2023-11-08 LAB — OB RESULTS CONSOLE HGB/HCT, BLOOD
HCT: 39 (ref 29–41)
Hemoglobin: 13

## 2023-11-08 LAB — OB RESULTS CONSOLE ANTIBODY SCREEN: Antibody Screen: NEGATIVE

## 2023-11-08 LAB — RESULTS CONSOLE HPV: CHL HPV: POSITIVE

## 2023-11-08 LAB — OB RESULTS CONSOLE GC/CHLAMYDIA
Chlamydia: NEGATIVE
Neisseria Gonorrhea: NEGATIVE

## 2023-11-08 LAB — OB RESULTS CONSOLE RUBELLA ANTIBODY, IGM: Rubella: IMMUNE

## 2023-11-08 LAB — OB RESULTS CONSOLE VARICELLA ZOSTER ANTIBODY, IGG: Varicella: IMMUNE

## 2023-11-08 LAB — OB RESULTS CONSOLE PLATELET COUNT: Platelets: 195

## 2023-11-08 LAB — HM PAP SMEAR

## 2023-11-08 LAB — OB RESULTS CONSOLE ABO/RH: RH Type: POSITIVE

## 2023-11-08 LAB — OB RESULTS CONSOLE HIV ANTIBODY (ROUTINE TESTING): HIV: NONREACTIVE

## 2023-11-08 LAB — HEPATITIS C ANTIBODY: HCV Ab: NEGATIVE

## 2024-03-17 ENCOUNTER — Ambulatory Visit: Payer: Self-pay

## 2024-03-17 ENCOUNTER — Other Ambulatory Visit (HOSPITAL_COMMUNITY)
Admission: RE | Admit: 2024-03-17 | Discharge: 2024-03-17 | Disposition: A | Discharge status: Home / Self Care | Payer: PRIVATE HEALTH INSURANCE | Source: Ambulatory Visit | Attending: Obstetrics and Gynecology | Admitting: Obstetrics and Gynecology

## 2024-03-17 VITALS — BP 110/70 | HR 111 | Wt 152.9 lb

## 2024-03-17 DIAGNOSIS — Z3A26 26 weeks gestation of pregnancy: Secondary | ICD-10-CM | POA: Diagnosis not present

## 2024-03-17 DIAGNOSIS — N898 Other specified noninflammatory disorders of vagina: Secondary | ICD-10-CM | POA: Insufficient documentation

## 2024-03-17 DIAGNOSIS — Z3480 Encounter for supervision of other normal pregnancy, unspecified trimester: Secondary | ICD-10-CM | POA: Insufficient documentation

## 2024-03-17 DIAGNOSIS — Z3482 Encounter for supervision of other normal pregnancy, second trimester: Secondary | ICD-10-CM

## 2024-03-17 DIAGNOSIS — O26891 Other specified pregnancy related conditions, first trimester: Secondary | ICD-10-CM | POA: Diagnosis present

## 2024-03-17 MED ORDER — VITAFOL GUMMIES 3.33-0.333-34.8 MG PO CHEW: 3.0000 | CHEWABLE_TABLET | Freq: Every day | ORAL | 11 refills | Status: AC

## 2024-03-17 NOTE — Patient Instructions (Signed)

## 2024-03-17 NOTE — Progress Notes (Signed)
 New OB Intake Transfer from [facility] at [redacted]w[redacted]d.  I connected with Mahlon LITTIE Pacini  on 03/17/2024 at 10:15 AM EST by In Person Visit and verified that I am speaking with the correct person using two identifiers. Nurse is located at [facility] and pt is located at [place].  I discussed the limitations, risks, security and privacy concerns of performing an evaluation and management service by telephone and the availability of in person appointments. I also discussed with the patient that there may be a patient responsible charge related to this service. The patient expressed understanding and agreed to proceed.  I explained I am completing New OB Intake today. We discussed EDD of 06/23/24 based on US  at 8 weeks ([facility]). Pt is G3P1011. I reviewed her allergies, medications and Medical/Surgical/OB history.    There are no active problems to display for this patient.    Concerns addressed today  Delivery Plans Plans to deliver at [facility]. Discussed the nature of our practice with multiple providers including residents and students as well as female and female providers. Due to the size of the practice, the delivering provider may not be the same as those providing prenatal care.   Patient is interested in water birth.  MyChart/Babyscripts MyChart access verified. I explained pt will have some visits in office and some virtually. Babyscripts instructions given and order placed. Patient verifies receipt of registration text/e-mail. Account successfully created and app downloaded. If patient is a candidate for Optimized scheduling, add to sticky note.   Blood Pressure Cuff/Weight Scale Blood pressure cuff ordered for patient to pick-up from [employer]. Explained after first prenatal appt pt will check weekly and document in Babyscripts. Patient does not have weight scale; patient may purchase if they desire to track weight weekly in Babyscripts.  Anatomy US  Explained first scheduled US  will be  around 19 weeks. Anatomy US  done at [facility].  Is patient a candidate for Babyscripts Optimization? No, due to Late Transfer.   First visit review I reviewed new OB appt with patient. Explained pt will be seen by Dr. Eveline at first visit. Routine prenatal labs already collected at [facility]. Vaginal swab today for complaint of vaginal discharge with odor.   Last Pap No results found for: DIAGPAP  Rocky CHRISTELLA Ober, RN 03/17/2024  11:27 AM

## 2024-03-18 ENCOUNTER — Other Ambulatory Visit: Payer: Self-pay

## 2024-03-18 ENCOUNTER — Ambulatory Visit: Payer: Self-pay | Admitting: Obstetrics and Gynecology

## 2024-03-18 ENCOUNTER — Encounter: Payer: Self-pay | Admitting: Advanced Practice Midwife

## 2024-03-18 LAB — CERVICOVAGINAL ANCILLARY ONLY
Bacterial Vaginitis (gardnerella): NEGATIVE
Candida Glabrata: NEGATIVE
Candida Vaginitis: NEGATIVE
Comment: NEGATIVE
Comment: NEGATIVE
Comment: NEGATIVE

## 2024-03-19 ENCOUNTER — Ambulatory Visit (INDEPENDENT_AMBULATORY_CARE_PROVIDER_SITE_OTHER): Payer: PRIVATE HEALTH INSURANCE | Admitting: Obstetrics & Gynecology

## 2024-03-19 VITALS — BP 115/73 | HR 111 | Wt 157.0 lb

## 2024-03-19 DIAGNOSIS — O282 Abnormal cytological finding on antenatal screening of mother: Secondary | ICD-10-CM | POA: Diagnosis not present

## 2024-03-19 DIAGNOSIS — Z3A26 26 weeks gestation of pregnancy: Secondary | ICD-10-CM | POA: Diagnosis not present

## 2024-03-19 DIAGNOSIS — R87619 Unspecified abnormal cytological findings in specimens from cervix uteri: Secondary | ICD-10-CM | POA: Insufficient documentation

## 2024-03-19 DIAGNOSIS — Z3480 Encounter for supervision of other normal pregnancy, unspecified trimester: Secondary | ICD-10-CM

## 2024-03-19 MED ORDER — PANTOPRAZOLE SODIUM 40 MG PO TBEC: 40.0000 mg | DELAYED_RELEASE_TABLET | Freq: Every day | ORAL | 3 refills | Status: DC

## 2024-03-19 NOTE — Progress Notes (Unsigned)
°  Subjective:transfer from [place]    Michele Yoder is a [id number] [redacted]w[redacted]d being seen today for her first obstetrical visit.  Her obstetrical history is significant for wants a WB. She had episiotomy with first delivery. Patient does intend to breast feed. Pregnancy history fully reviewed.  Patient reports no complaints.  Vitals:   03/19/24 0831  BP: 115/73  Pulse: (!) 111  Weight: 157 lb (71.2 kg)    HISTORY: OB History  Gravida Para Term Preterm AB Living  3 1 1  0 1 1  SAB IAB Ectopic Multiple Live Births  1 0 0 0 1    # Outcome Date GA Lbr Len/2nd Weight Sex Type Anes PTL Lv  3 Current           2 Term 06/21/20 [redacted]w[redacted]d   F Vag-Spont EPI  LIV     Birth Comments: LML,bottle  1 SAB 2021     SAB      Past Medical History:  Diagnosis Date   GERD (gastroesophageal reflux disease)    Past Surgical History:  Procedure Laterality Date   ADENOIDECTOMY     TONSILLECTOMY     Family History  Problem Relation Age of Onset   Hypertension Mother      Exam    Uterus:   53  Pelvic Exam: Deferred, already done                                   Skin: normal coloration and turgor, no rashes    Neurologic: oriented, normal mood, grossly non-focal   Extremities: normal strength, tone, and muscle mass   HEENT PERRLA   Mouth/Teeth mucous membranes moist, pharynx normal without lesions   Neck supple   Cardiovascular: regular rate and rhythm   Respiratory:  appears well, vitals normal, no respiratory distress, acyanotic, normal RR   Abdomen:    Urinary:       Assessment:    Pregnancy: G3P1011 Patient Active Problem List   Diagnosis Date Noted   ASCUS (atypical squamous cells of undetermined significance) on gynecologic Papanicolaou smear complicating pregnancy, antepartum 03/19/2024   Supervision of other normal pregnancy, antepartum 03/17/2024        Plan:     Initial labs drawn. Prenatal vitamins. Problem list reviewed and updated. Genetic Screening  discussed : results reviewed.  Ultrasound discussed; fetal survey: ordered.  Follow up in 4 weeks. 50% of 30 min visit spent on counseling and coordination of care.  Protonix  for HB MW f/u US  f/u   Lynwood Solomons 03/19/2024

## 2024-03-27 ENCOUNTER — Other Ambulatory Visit: Payer: Self-pay | Admitting: *Deleted

## 2024-03-27 NOTE — Progress Notes (Signed)
 Chart review: US  MFM OB Follow Up was ordered on 03/19/24 and has not been scheduled . Staff message sent to MFM.

## 2024-03-31 ENCOUNTER — Other Ambulatory Visit: Payer: Self-pay | Admitting: *Deleted

## 2024-03-31 DIAGNOSIS — Z3480 Encounter for supervision of other normal pregnancy, unspecified trimester: Secondary | ICD-10-CM

## 2024-04-07 ENCOUNTER — Other Ambulatory Visit: Payer: Self-pay

## 2024-04-07 ENCOUNTER — Other Ambulatory Visit (HOSPITAL_COMMUNITY)
Admission: RE | Admit: 2024-04-07 | Discharge: 2024-04-07 | Disposition: A | Discharge status: Home / Self Care | Payer: PRIVATE HEALTH INSURANCE | Source: Ambulatory Visit | Attending: Advanced Practice Midwife | Admitting: Advanced Practice Midwife

## 2024-04-07 ENCOUNTER — Encounter: Payer: Self-pay | Admitting: Advanced Practice Midwife

## 2024-04-07 ENCOUNTER — Ambulatory Visit: Payer: PRIVATE HEALTH INSURANCE | Admitting: Advanced Practice Midwife

## 2024-04-07 VITALS — BP 117/78 | HR 110 | Wt 157.0 lb

## 2024-04-07 DIAGNOSIS — O26893 Other specified pregnancy related conditions, third trimester: Secondary | ICD-10-CM

## 2024-04-07 DIAGNOSIS — Z3A29 29 weeks gestation of pregnancy: Secondary | ICD-10-CM | POA: Diagnosis not present

## 2024-04-07 DIAGNOSIS — R102 Pelvic and perineal pain unspecified side: Secondary | ICD-10-CM

## 2024-04-07 DIAGNOSIS — Z3480 Encounter for supervision of other normal pregnancy, unspecified trimester: Secondary | ICD-10-CM | POA: Insufficient documentation

## 2024-04-07 DIAGNOSIS — O24410 Gestational diabetes mellitus in pregnancy, diet controlled: Secondary | ICD-10-CM

## 2024-04-07 DIAGNOSIS — R12 Heartburn: Secondary | ICD-10-CM | POA: Diagnosis not present

## 2024-04-07 DIAGNOSIS — O26892 Other specified pregnancy related conditions, second trimester: Secondary | ICD-10-CM

## 2024-04-07 MED ORDER — FAMOTIDINE 20 MG PO TABS: 20.0000 mg | ORAL_TABLET | Freq: Two times a day (BID) | ORAL | 1 refills | Status: AC | PRN

## 2024-04-07 NOTE — Progress Notes (Signed)
 Pt presents for OB visit. Transfer from [facility]. Declined Tdap. Interested in water birth

## 2024-04-07 NOTE — Progress Notes (Signed)
 "  PRENATAL VISIT NOTE  Subjective:  Michele Yoder is a 34 y.o. G3P1011 at [redacted]w[redacted]d being seen today for ongoing prenatal care.  She is currently monitored for the following issues for this low-risk pregnancy and has Supervision of other normal pregnancy, antepartum and ASCUS (atypical squamous cells of undetermined significance) on gynecologic Papanicolaou smear complicating pregnancy, antepartum on their problem list.  Patient reports pelvic pain and heartburn.  Contractions: Not present. Vag. Bleeding: None.  Movement: Present. Denies leaking of fluid.   The following portions of the patient's history were reviewed and updated as appropriate: allergies, current medications, past family history, past medical history, past social history, past surgical history and problem list.   Objective:   Vitals:   04/07/24 0937  BP: 117/78  Pulse: (!) 110  Weight: 71.2 kg    Fetal Status:  Fetal Heart Rate (bpm): 142 Fundal Height: 29 cm Movement: Present    General: Alert, oriented and cooperative. Patient is in no acute distress.  Skin: Skin is warm and dry. No rash noted.   Cardiovascular: Normal heart rate noted  Respiratory: Normal respiratory effort, no problems with respiration noted  Abdomen: Soft, gravid, appropriate for gestational age.  Pain/Pressure: Present     Pelvic: Cervical exam deferred        Extremities: Normal range of motion.  Edema: Trace  Mental Status: Normal mood and affect. Normal behavior. Normal judgment and thought content.      03/17/2024   12:14 PM  Depression screen PHQ 2/9  Decreased Interest 1  Down, Depressed, Hopeless 0  PHQ - 2 Score 1  Altered sleeping 2  Tired, decreased energy 2  Change in appetite 0  Feeling bad or failure about yourself  0  Trouble concentrating 0  Moving slowly or fidgety/restless 0  Suicidal thoughts 0  PHQ-9 Score 5        03/17/2024   12:14 PM  GAD 7 : Generalized Anxiety Score  Nervous, Anxious, on Edge 1   Control/stop worrying 1  Worry too much - different things 0  Trouble relaxing 0  Restless 2  Easily annoyed or irritable 0  Afraid - awful might happen 0  Total GAD 7 Score 4    Assessment and Plan:  Pregnancy: G3P1011 at [redacted]w[redacted]d 1. Supervision of other normal pregnancy, antepartum (Primary) --Anticipatory guidance about next visits/weeks of pregnancy given.   2. [redacted] weeks gestation of pregnancy   3. Pelvic pain during pregnancy in second trimester, antepartum --Rest/ice/heat/warm bath/increase PO fluids/Tylenol /pregnancy support belt  - Ambulatory referral to Physical Therapy  4. Heartburn during pregnancy in third trimester --Pt does not like to take medicine. Reviewed lifestyle changes including small frequent meals, foods to avoid, avoiding lying down after eating.   --Changed Rx to Pepcid   - famotidine  (PEPCID ) 20 MG tablet; Take 1 tablet (20 mg total) by mouth 2 (two) times daily as needed for heartburn or indigestion.  Dispense: 60 tablet; Refill: 1   Preterm labor symptoms and general obstetric precautions including but not limited to vaginal bleeding, contractions, leaking of fluid and fetal movement were reviewed in detail with the patient. Please refer to After Visit Summary for other counseling recommendations.   Return in about 2 weeks (around 04/21/2024) for Midwife preferred.  Future Appointments  Date Time Provider Department Center  05/01/2024  8:00 AM [facility] PROVIDER 1 [facility]  05/01/2024  8:30 AM [facility] US5 [facility]  05/05/2024 10:55 AM Leftwich-Kirby, Michele LABOR, CNM [facility] None  Michele Yoder, CNM  "

## 2024-04-07 NOTE — Addendum Note (Signed)
 Addended by: ALVIA ROSINA GAILS on: 04/07/2024 04:53 PM   Modules accepted: Orders

## 2024-04-08 ENCOUNTER — Ambulatory Visit: Payer: Self-pay | Admitting: Advanced Practice Midwife

## 2024-04-08 ENCOUNTER — Encounter: Payer: Self-pay | Admitting: Advanced Practice Midwife

## 2024-04-08 DIAGNOSIS — O24410 Gestational diabetes mellitus in pregnancy, diet controlled: Secondary | ICD-10-CM | POA: Insufficient documentation

## 2024-04-08 DIAGNOSIS — O24419 Gestational diabetes mellitus in pregnancy, unspecified control: Secondary | ICD-10-CM

## 2024-04-08 LAB — CBC
Hematocrit: 33.6 % — ABNORMAL LOW (ref 34.0–46.6)
Hemoglobin: 10.7 g/dL — ABNORMAL LOW (ref 11.1–15.9)
MCH: 28.8 pg (ref 26.6–33.0)
MCHC: 31.8 g/dL (ref 31.5–35.7)
MCV: 91 fL (ref 79–97)
Platelets: 184 x10E3/uL (ref 150–450)
RBC: 3.71 x10E6/uL — ABNORMAL LOW (ref 3.77–5.28)
RDW: 12.5 % (ref 11.7–15.4)
WBC: 8.2 x10E3/uL (ref 3.4–10.8)

## 2024-04-08 LAB — GLUCOSE TOLERANCE, 2 HOURS W/ 1HR
Glucose, 1 hour: 194 mg/dL — ABNORMAL HIGH (ref 70–179)
Glucose, 2 hour: 109 mg/dL (ref 70–152)
Glucose, Fasting: 78 mg/dL (ref 70–91)

## 2024-04-08 LAB — CERVICOVAGINAL ANCILLARY ONLY
Bacterial Vaginitis (gardnerella): NEGATIVE
Candida Glabrata: NEGATIVE
Candida Vaginitis: NEGATIVE
Chlamydia: NEGATIVE
Comment: NEGATIVE
Comment: NEGATIVE
Comment: NEGATIVE
Comment: NEGATIVE
Comment: NEGATIVE
Comment: NORMAL
Neisseria Gonorrhea: NEGATIVE
Trichomonas: NEGATIVE

## 2024-04-08 LAB — SYPHILIS: RPR W/REFLEX TO RPR TITER AND TREPONEMAL ANTIBODIES, TRADITIONAL SCREENING AND DIAGNOSIS ALGORITHM: RPR Ser Ql: NONREACTIVE

## 2024-04-08 LAB — HIV ANTIBODY (ROUTINE TESTING W REFLEX): HIV Screen 4th Generation wRfx: NONREACTIVE

## 2024-04-08 MED ORDER — ACCU-CHEK GUIDE TEST VI STRP: 1.0000 | ORAL_STRIP | Freq: Four times a day (QID) | 12 refills | Status: AC

## 2024-04-08 MED ORDER — ACCU-CHEK SOFTCLIX LANCETS MISC: 100.0000 | Freq: Four times a day (QID) | 12 refills | Status: AC

## 2024-04-08 MED ORDER — ACCU-CHEK GUIDE W/DEVICE KIT: 1.0000 | PACK | Freq: Four times a day (QID) | 0 refills | Status: AC

## 2024-04-08 NOTE — Therapy (Signed)
 " OUTPATIENT PHYSICAL THERAPY FEMALE PELVIC EVALUATION   Patient Name: Michele Yoder MRN: [id number] DOB:09-13-1990, 34 y.o., female Today's Date: 04/09/2024  END OF SESSION:  PT End of Session - 04/09/24 1645     Visit Number 1    Date for Recertification  10/07/24    Authorization Type [place] MEDICAID [facility]    Authorization Time Period waiting on auth    PT Start Time 1615    PT Stop Time 1700    PT Time Calculation (min) 45 min    Activity Tolerance Patient tolerated treatment well    Behavior During Therapy WFL for tasks assessed/performed          Past Medical History:  Diagnosis Date   GERD (gastroesophageal reflux disease)    Past Surgical History:  Procedure Laterality Date   ADENOIDECTOMY     TONSILLECTOMY     Patient Active Problem List   Diagnosis Date Noted   Gestational diabetes, diet controlled 04/08/2024   ASCUS (atypical squamous cells of undetermined significance) on gynecologic Papanicolaou smear complicating pregnancy, antepartum 03/19/2024   Supervision of other normal pregnancy, antepartum 03/17/2024    PCP: no  REFERRING PROVIDER: Milly Olam LABOR, CNM  REFERRING DIAG: [id number] (ICD-10-CM) - Pelvic pain during pregnancy in second trimester, antepartum  THERAPY DIAG:  Muscle weakness (generalized)  Other abnormalities of gait and mobility  Pelvic pain Rationale for Evaluation and Treatment: Rehabilitation   ONSET DATE: 03/2024   SUBJECTIVE:                                                                                                                                                                                            SUBJECTIVE STATEMENT: Patient has had a pelvic pain since about 2-3 weeks ago, she is [redacted] weeks pregnant.  Walking aggravates it, sitting too long, getting in and out of the car. Pain is in the front and in the back of pelvic girdle Standing on one foot is hard.    PAIN:  Are you having pain? Yes NPRS  scale: 10/10 Pain location: External, Anterior, and Posterior   Pain type: sharp Pain description: constant    Aggravating factors: moving Relieving factors: side lying    PRECAUTIONS: None   RED FLAGS: None       WEIGHT BEARING RESTRICTIONS: No   FALLS:  Has patient fallen in last 6 months? No   OCCUPATION: cannot drive anymore, works for post [employer]   ACTIVITY LEVEL : not to active now due to pain   PLOF: Independent   PATIENT GOALS: less pain   PERTINENT HISTORY:  non   BOWEL MOVEMENT:  no    URINATION:no   INTERCOURSE: have not been since december            PREGNANCY: 29 weeks, 1 daughter 2022 Vaginal deliveries 1 Tearing No Episiotomy Yes  C-section deliveries 0 Currently pregnant Yes: 29   PROLAPSE: Pressure     OBJECTIVE:  Note: Objective measures were completed at Evaluation unless otherwise noted.   03/05/24 PATIENT SURVEYS:    ODI: 58%   COGNITION: Overall cognitive status: Within functional limits for tasks assessed                          SENSATION: Light touch: Appears intact     FUNCTIONAL TESTS:  Squat: mini squat with  Single leg stance: painful             Rt: painful             Lt: painful Curl-up test: 1/4 very difficult, painful  Active straight leg raise: painful     GAIT: Assistive device utilized: None Comments: WNL   POSTURE: pregnant     LUMBARAROM/PROM: very limited due to pain   A/PROM A/PROM  Eval (% available)  Flexion  30  Extension  50  Right lateral flexion  50  Left lateral flexion  50  Right rotation  50  Left rotation  50   (Blank rows = not tested)   PALPATION: General:    Abdominal: pregnant Breathing: upper chest Tenderness: bilateral SI joints, pubic symphysis Scar tissue: no Diastasis: yes- pregnant                 External Perineal Exam: to be assessed if needed                             Internal Pelvic Floor: to be assessed    Patient confirms identification and  approves PT to assess internal pelvic floor and treatment No   PELVIC MMT:   MMT eval  Vaginal    Internal Anal Sphincter    External Anal Sphincter    Puborectalis    (Blank rows = not tested)         TONE: to be assessed if needed   PROLAPSE: to be assessed if needed   TODAY'S TREATMENT:                                                                                                                              DATE: 04/09/2024  EVAL   Neuromuscular re-education: hip adduction with ball + exhale   Therapeutic activities: trial of serola belt Nu step 5 mins with serola belt, transfers and amb with belt to assess if it works  education on transfers with legs close together to reduce irritation on PS Pt was educated on relevant anatomy, exam findings, home exercise program, plan of care, expectations of PT  PATIENT EDUCATION:  Education details: See above Person educated: Patient Education method: Explanation, Demonstration, Tactile cues, Verbal cues, and Handouts Education comprehension: verbalized understanding   HOME EXERCISE PROGRAM: Access Code: [email or web address] URL: [email or web address] Date: 04/09/2024 Prepared by: Cori Gerardine Peltz  Exercises - Diaphragmatic Breathing to Reduce Intra-abdominal Pressure: Lifting a Basket  - 1 x daily - 7 x weekly - 2 sets - 10 reps - Diaphragmatic Breathing to Reduce Intra-abdominal Pressure: Sit to Stand  - 1 x daily - 7 x weekly - 2 sets - 10 reps - Seated Isometric Hip Adduction with Pelvic Floor Contraction  - 1 x daily - 7 x weekly - 2 sets - 10 reps   ASSESSMENT:   CLINICAL IMPRESSION: Patient is a 33  who was seen today for physical therapy evaluation and treatment for pelvic pain with pregnancy. She had her pain gone with trial of serola belt and was able to walk and transfer pain free. 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 .    OBJECTIVE IMPAIRMENTS: decreased activity tolerance, decreased coordination, decreased endurance, decreased mobility, decreased ROM, decreased strength, increased fascial restrictions, increased muscle spasms, impaired flexibility, impaired tone, improper body mechanics, postural dysfunction, and pain.    ACTIVITY LIMITATIONS: carrying, lifting, bending, sitting, standing, squatting, sleeping, transfers, locomotion level, and caring for others   PARTICIPATION LIMITATIONS: meal prep, cleaning, laundry, interpersonal relationship, shopping, community activity, occupation, and yard work   PERSONAL FACTORS: Time since onset of injury/illness/exacerbation are also affecting patient's functional outcome.    REHAB POTENTIAL: Good   CLINICAL DECISION MAKING: Stable/uncomplicated   EVALUATION COMPLEXITY: Low     GOALS: Goals reviewed with patient? Yes   SHORT TERM GOALS: Target date: 05/07/2024       Pt will be independent with HEP in order to improve activity tolerance.    Baseline: Goal status: INITIAL   2.  Pt will have reduced pelvic girdle pain to max 4/10 in order to be able to do functional activities such as bending, lifting, twisting and walking as needed to be able to take care of her family and participate in job duties.    Baseline: 10/10 Goal status: INITIAL   3.  Patient will be educated on perineal massage in order to reduce chance of tearing during delivery   Baseline:  Goal status: INITIAL   4.  Patient will be able to participate in 45 min PT session without increased pain  Baseline:  Goal status: INITIAL     LONG TERM GOALS: Target date: due date or 2 weeks before    Pt will be independent with advanced HEP in order to improve activity tolerance.    Baseline:  Goal status: INITIAL   2.  Patient will have reduced pelvic girdle pain to max 1/10 in order to be able to do functional activities  such as bending, lifting, twisting and walking as needed to be able to take care of her family and participate in job duties.  Baseline:  Goal status: INITIAL   3.   Pt will be able to correctly perform diaphragmatic breathing and appropriate pressure management in order to prevent worsening vaginal pressure and improve pelvic floor A/ROM.   Baseline:  Goal status: INITIAL      PLAN:   PT FREQUENCY: 1-2x/week   PT DURATION: until due date about 10-11 weeks  PLANNED INTERVENTIONS: 97164- PT Re-evaluation, 97110-Therapeutic exercises, 97530- Therapeutic activity, 97112- Neuromuscular re-education, [phone]- Self Care, 02859- Manual therapy, [phone]- Gait training, [phone]- Aquatic Therapy, [phone]- Electrical stimulation (unattended), [phone]- Traction (mechanical), [id number]- Ionotophoresis 4mg /ml Dexamethasone, 20560 (1-2 muscles), 20561 (3+ muscles)- Dry Needling, Patient/Family education, Balance training, Taping, Joint mobilization, Joint manipulation, Spinal manipulation, Spinal mobilization, Scar mobilization, Vestibular training, Cryotherapy, Moist heat, and Biofeedback   PLAN FOR NEXT SESSION: continue core strengthening as she can for improved pelvic stability   Perline Awe, PT, DPT 04/09/2024 5:06 PM  [facility] [address] [place], [place] [address] Phone # [phone] Fax [phone]  "

## 2024-04-09 ENCOUNTER — Encounter: Payer: Self-pay | Admitting: Physical Therapy

## 2024-04-09 ENCOUNTER — Ambulatory Visit: Attending: Advanced Practice Midwife | Admitting: Physical Therapy

## 2024-04-09 ENCOUNTER — Other Ambulatory Visit: Payer: Self-pay

## 2024-04-09 DIAGNOSIS — O26892 Other specified pregnancy related conditions, second trimester: Secondary | ICD-10-CM | POA: Insufficient documentation

## 2024-04-09 DIAGNOSIS — M6281 Muscle weakness (generalized): Secondary | ICD-10-CM | POA: Diagnosis present

## 2024-04-09 DIAGNOSIS — R2689 Other abnormalities of gait and mobility: Secondary | ICD-10-CM | POA: Insufficient documentation

## 2024-04-09 DIAGNOSIS — R102 Pelvic and perineal pain unspecified side: Secondary | ICD-10-CM | POA: Insufficient documentation

## 2024-05-01 ENCOUNTER — Ambulatory Visit (HOSPITAL_BASED_OUTPATIENT_CLINIC_OR_DEPARTMENT_OTHER): Payer: PRIVATE HEALTH INSURANCE

## 2024-05-01 ENCOUNTER — Ambulatory Visit: Payer: PRIVATE HEALTH INSURANCE | Attending: Obstetrics and Gynecology | Admitting: Obstetrics and Gynecology

## 2024-05-01 ENCOUNTER — Other Ambulatory Visit: Payer: Self-pay | Admitting: *Deleted

## 2024-05-01 VITALS — BP 105/57 | HR 101

## 2024-05-01 DIAGNOSIS — O99213 Obesity complicating pregnancy, third trimester: Secondary | ICD-10-CM | POA: Diagnosis not present

## 2024-05-01 DIAGNOSIS — O358XX0 Maternal care for other (suspected) fetal abnormality and damage, not applicable or unspecified: Secondary | ICD-10-CM | POA: Diagnosis not present

## 2024-05-01 DIAGNOSIS — Z3480 Encounter for supervision of other normal pregnancy, unspecified trimester: Secondary | ICD-10-CM

## 2024-05-01 DIAGNOSIS — O24410 Gestational diabetes mellitus in pregnancy, diet controlled: Secondary | ICD-10-CM

## 2024-05-01 DIAGNOSIS — O0933 Supervision of pregnancy with insufficient antenatal care, third trimester: Secondary | ICD-10-CM | POA: Insufficient documentation

## 2024-05-01 DIAGNOSIS — Z3A32 32 weeks gestation of pregnancy: Secondary | ICD-10-CM | POA: Diagnosis not present

## 2024-05-01 DIAGNOSIS — Z363 Encounter for antenatal screening for malformations: Secondary | ICD-10-CM | POA: Insufficient documentation

## 2024-05-01 NOTE — Progress Notes (Signed)
 Maternal-Fetal Medicine Consultation  Name: Michele Yoder  MRN: [id number]  GA: [id number] [redacted]w[redacted]d   Patient is here for fetal anatomy scan.  [facility]. On cell-free fetal DNA screening, the risks of fetal aneuploidies are not increased.  MSAFP screening showed low risk for open neural tube defects.  She has gestational diabetes.  Her fasting levels are up to 103 mg/dL and postprandial levels are up to 133 mg/dL.  Ultrasound Fetal growth is appropriate for gestational age.  Normal amniotic fluid.  Fetal anatomical survey appears normal but limited by advanced gestational age  I discussed the importance of good blood glucose control to prevent adverse outcomes.  I discussed normal glucose parameters.  I counseled the patient on the importance of diet, exercise and medical treatment if necessary to control diabetes.  If diabetes is not well-controlled on diet, oral hypoglycemics or insulin should be initiated. Delivery timing should be based on diabetic control.  Recommendations - Weekly NST at your office. - Fetal growth assessment and BPP in 4 weeks at our office.     Consultation including face-to-face (more than 50%) counseling 20 minutes.

## 2024-05-05 ENCOUNTER — Ambulatory Visit: Payer: Self-pay | Admitting: Advanced Practice Midwife

## 2024-05-05 ENCOUNTER — Encounter: Payer: Self-pay | Admitting: Advanced Practice Midwife

## 2024-05-05 VITALS — BP 115/70 | HR 99 | Wt 163.2 lb

## 2024-05-05 DIAGNOSIS — L299 Pruritus, unspecified: Secondary | ICD-10-CM

## 2024-05-05 DIAGNOSIS — Z3480 Encounter for supervision of other normal pregnancy, unspecified trimester: Secondary | ICD-10-CM

## 2024-05-05 DIAGNOSIS — Z3A33 33 weeks gestation of pregnancy: Secondary | ICD-10-CM

## 2024-05-05 DIAGNOSIS — O24419 Gestational diabetes mellitus in pregnancy, unspecified control: Secondary | ICD-10-CM

## 2024-05-05 MED ORDER — DEXCOM G7 SENSOR MISC: 1.0000 | Freq: Once | 3 refills | Status: AC

## 2024-05-05 NOTE — Progress Notes (Signed)
 Pt presents for ROB visit. No concerns

## 2024-05-12 ENCOUNTER — Ambulatory Visit: Admitting: Physical Therapy

## 2024-05-19 ENCOUNTER — Ambulatory Visit: Admitting: Physical Therapy

## 2024-05-20 ENCOUNTER — Encounter: Payer: Self-pay | Admitting: Family Medicine

## 2024-05-26 ENCOUNTER — Ambulatory Visit: Admitting: Physical Therapy

## 2024-06-02 ENCOUNTER — Ambulatory Visit: Admitting: Physical Therapy
